# Patient Record
Sex: Male | Born: 1980 | Race: White | Hispanic: Yes | Marital: Single | Smoking: Never smoker
Health system: Southern US, Community
[De-identification: ages and names within clinical notes are randomized; demographics above are authoritative.]

## PROBLEM LIST (undated history)

## (undated) DIAGNOSIS — N390 Urinary tract infection, site not specified: Secondary | ICD-10-CM

## (undated) DIAGNOSIS — A09 Infectious gastroenteritis and colitis, unspecified: Secondary | ICD-10-CM

## (undated) DIAGNOSIS — I10 Essential (primary) hypertension: Secondary | ICD-10-CM

## (undated) DIAGNOSIS — K602 Anal fissure, unspecified: Secondary | ICD-10-CM

## (undated) DIAGNOSIS — E7800 Pure hypercholesterolemia, unspecified: Secondary | ICD-10-CM

## (undated) DIAGNOSIS — I4891 Unspecified atrial fibrillation: Secondary | ICD-10-CM

## (undated) HISTORY — DX: Urinary tract infection, site not specified: N39.0

## (undated) HISTORY — DX: Anal fissure, unspecified: K60.2

## (undated) HISTORY — DX: Essential (primary) hypertension: I10

## (undated) HISTORY — DX: Infectious gastroenteritis and colitis, unspecified: A09

## (undated) HISTORY — PX: APPENDECTOMY: SHX54

## (undated) HISTORY — DX: Pure hypercholesterolemia, unspecified: E78.00

## (undated) HISTORY — DX: Unspecified atrial fibrillation: I48.91

---

## 2010-07-21 ENCOUNTER — Inpatient Hospital Stay (HOSPITAL_COMMUNITY): Admission: EM | Admit: 2010-07-21 | Discharge: 2010-07-21 | Payer: Self-pay | Admitting: Emergency Medicine

## 2010-07-21 ENCOUNTER — Encounter (INDEPENDENT_AMBULATORY_CARE_PROVIDER_SITE_OTHER): Payer: Self-pay | Admitting: Internal Medicine

## 2011-01-22 LAB — POCT I-STAT, CHEM 8
BUN: 16 mg/dL (ref 6–23)
Calcium, Ion: 1.17 mmol/L (ref 1.12–1.32)
Chloride: 105 meq/L (ref 96–112)
Creatinine, Ser: 1 mg/dL (ref 0.4–1.5)
Glucose, Bld: 108 mg/dL — ABNORMAL HIGH (ref 70–99)
HCT: 52 % (ref 39.0–52.0)
Hemoglobin: 17.7 g/dL — ABNORMAL HIGH (ref 13.0–17.0)
Potassium: 3.6 meq/L (ref 3.5–5.1)
Sodium: 141 meq/L (ref 135–145)
TCO2: 26 mmol/L (ref 0–100)

## 2011-01-22 LAB — POCT CARDIAC MARKERS
CKMB, poc: <1 ng/mL — ABNORMAL LOW (ref 1.0–8.0)
Troponin i, poc: <0.05 ng/mL (ref 0.00–0.09)

## 2011-01-22 LAB — RAPID URINE DRUG SCREEN, HOSP PERFORMED
Amphetamines: NOT DETECTED
Cocaine: NOT DETECTED
Tetrahydrocannabinol: NOT DETECTED

## 2011-01-22 LAB — CARDIAC PANEL(CRET KIN+CKTOT+MB+TROPI)
CK, MB: 1.3 ng/mL (ref 0.3–4.0)
Relative Index: 1.3 (ref 0.0–2.5)
Total CK: 102 U/L (ref 7–232)

## 2011-01-22 LAB — CK TOTAL AND CKMB (NOT AT ARMC): Relative Index: 1.3 (ref 0.0–2.5)

## 2011-01-22 LAB — TSH: TSH: 1.565 u[IU]/mL (ref 0.350–4.500)

## 2011-01-22 LAB — TROPONIN I: Troponin I: 0.01 ng/mL (ref 0.00–0.06)

## 2012-04-02 IMAGING — CR DG CHEST 2V
2 series · 2 of 2 positions shown · non-contrast
Comparison: None.

CLINICAL DATA: Shortness of breath

CHEST - 2 VIEW

[w chest pa]
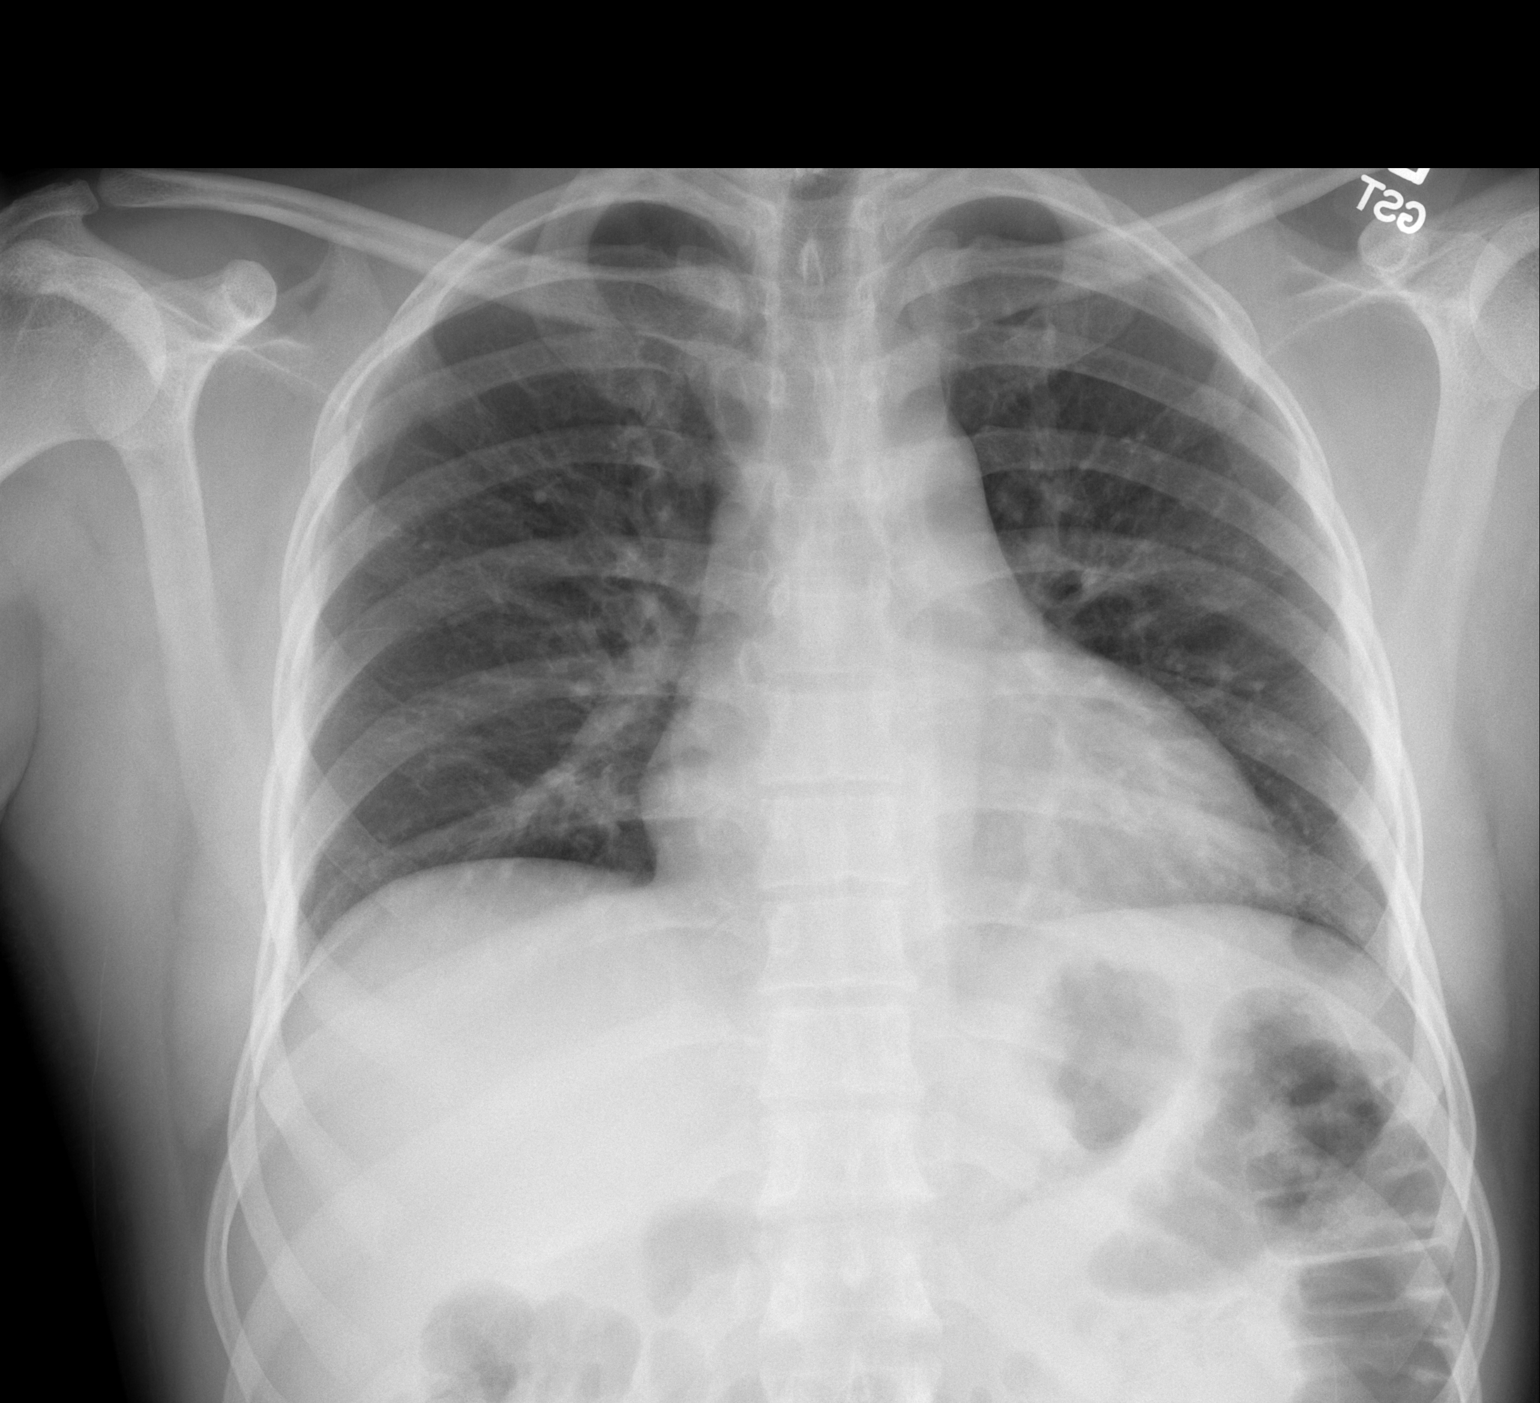

[w chest lat]
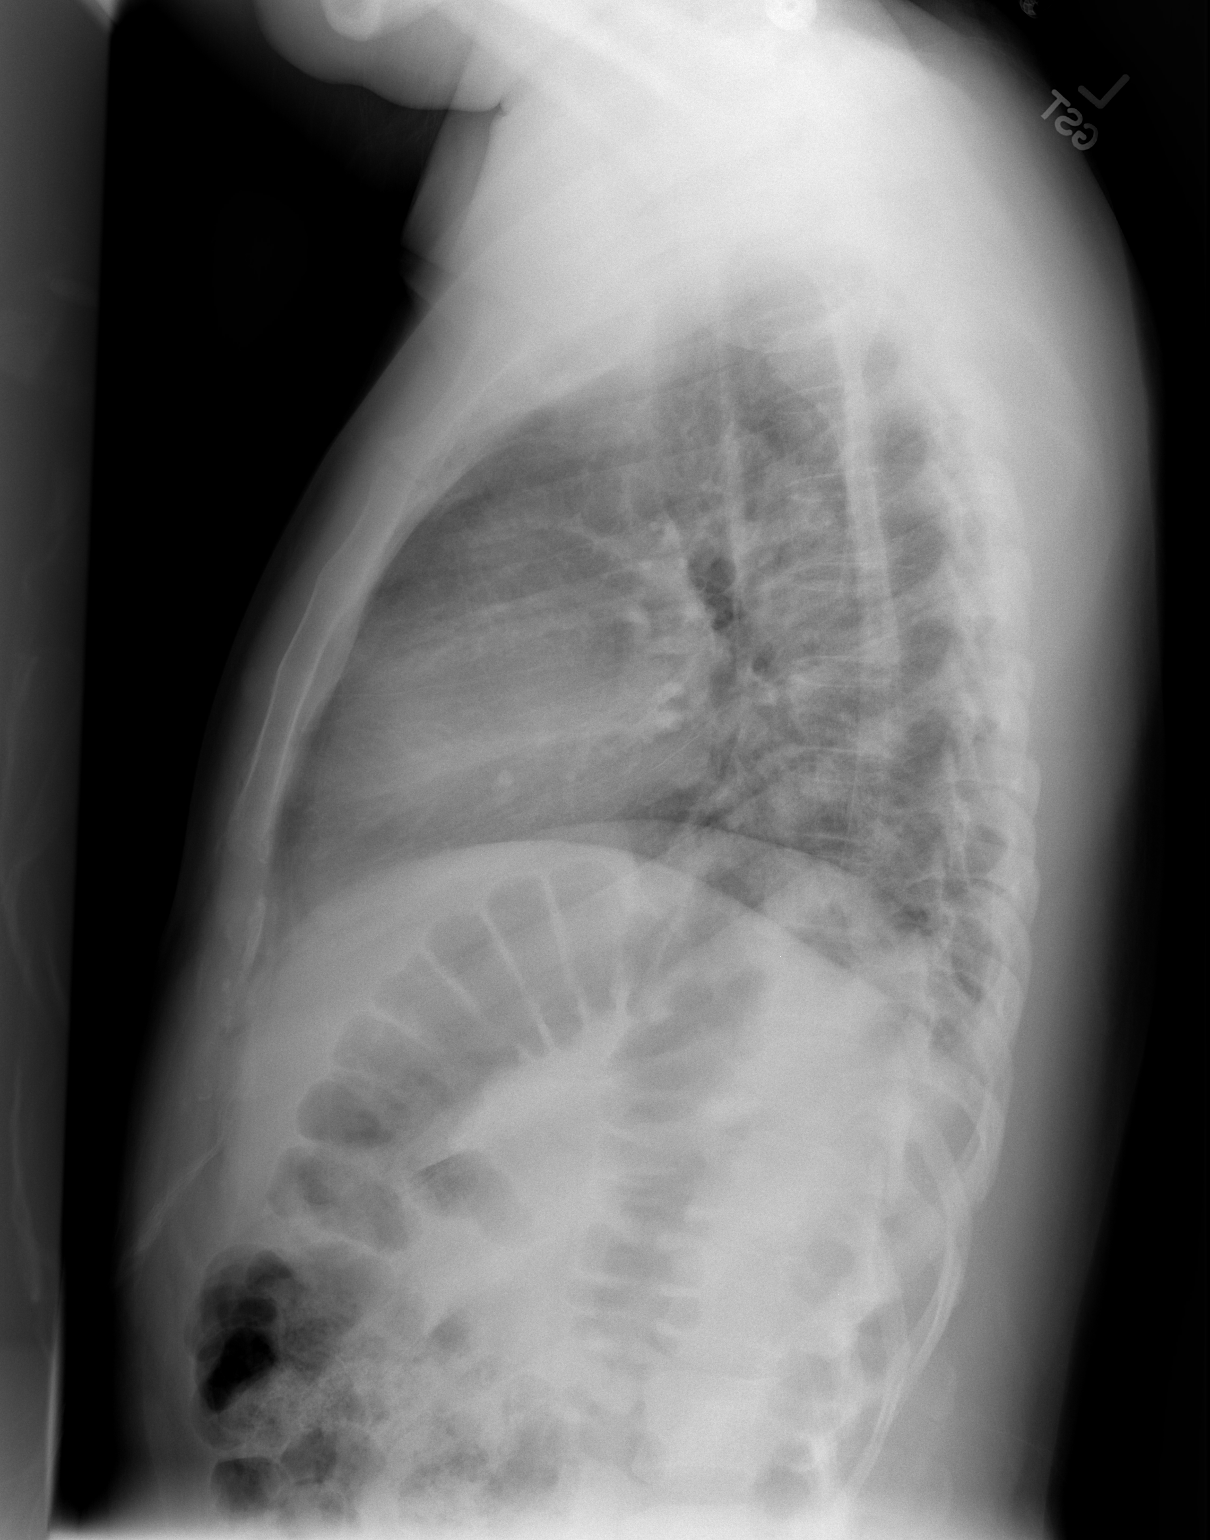

[2 of 2 positions shown; findings below may reference images not displayed]

FINDINGS: The lungs are clear.  Mediastinal contours are normal.
Heart is within upper limits of normal.  No bony abnormality is
seen.
IMPRESSION: No active lung disease.

## 2023-04-01 ENCOUNTER — Emergency Department (HOSPITAL_COMMUNITY)
Admission: EM | Admit: 2023-04-01 | Discharge: 2023-04-01 | Disposition: A | Discharge status: Home / Self Care | Payer: Self-pay | Attending: Emergency Medicine | Admitting: Emergency Medicine

## 2023-04-01 ENCOUNTER — Other Ambulatory Visit: Payer: Self-pay

## 2023-04-01 ENCOUNTER — Emergency Department (HOSPITAL_COMMUNITY): Payer: Self-pay

## 2023-04-01 ENCOUNTER — Encounter (HOSPITAL_COMMUNITY): Payer: Self-pay | Admitting: Emergency Medicine

## 2023-04-01 DIAGNOSIS — L03317 Cellulitis of buttock: Secondary | ICD-10-CM | POA: Insufficient documentation

## 2023-04-01 DIAGNOSIS — K641 Second degree hemorrhoids: Secondary | ICD-10-CM | POA: Insufficient documentation

## 2023-04-01 LAB — CBC WITH DIFFERENTIAL/PLATELET
Abs Immature Granulocytes: 0.01 10*3/uL (ref 0.00–0.07)
Basophils Absolute: 0.1 10*3/uL (ref 0.0–0.1)
Basophils Relative: 1 %
Eosinophils Absolute: 0.2 10*3/uL (ref 0.0–0.5)
Eosinophils Relative: 3 %
HCT: 47.2 % (ref 39.0–52.0)
Hemoglobin: 16.2 g/dL (ref 13.0–17.0)
Immature Granulocytes: 0 %
Lymphocytes Relative: 31 %
Lymphs Abs: 2 10*3/uL (ref 0.7–4.0)
MCH: 30.3 pg (ref 26.0–34.0)
MCHC: 34.3 g/dL (ref 30.0–36.0)
MCV: 88.2 fL (ref 80.0–100.0)
Monocytes Absolute: 0.4 10*3/uL (ref 0.1–1.0)
Monocytes Relative: 6 %
Neutro Abs: 3.8 10*3/uL (ref 1.7–7.7)
Neutrophils Relative %: 59 %
Platelets: 266 10*3/uL (ref 150–400)
RBC: 5.35 MIL/uL (ref 4.22–5.81)
RDW: 13.2 % (ref 11.5–15.5)
WBC: 6.4 10*3/uL (ref 4.0–10.5)
nRBC: 0 % (ref 0.0–0.2)

## 2023-04-01 LAB — URINALYSIS, ROUTINE W REFLEX MICROSCOPIC
Bilirubin Urine: NEGATIVE
Glucose, UA: NEGATIVE mg/dL
Hgb urine dipstick: NEGATIVE
Ketones, ur: NEGATIVE mg/dL
Leukocytes,Ua: NEGATIVE
Nitrite: NEGATIVE
Protein, ur: NEGATIVE mg/dL
Specific Gravity, Urine: 1.017 (ref 1.005–1.030)
pH: 5 (ref 5.0–8.0)

## 2023-04-01 LAB — COMPREHENSIVE METABOLIC PANEL
ALT: 28 U/L (ref 0–44)
AST: 24 U/L (ref 15–41)
Albumin: 4.1 g/dL (ref 3.5–5.0)
Alkaline Phosphatase: 48 U/L (ref 38–126)
Anion gap: 10 (ref 5–15)
BUN: 17 mg/dL (ref 6–20)
CO2: 23 mmol/L (ref 22–32)
Calcium: 9.5 mg/dL (ref 8.9–10.3)
Chloride: 101 mmol/L (ref 98–111)
Creatinine, Ser: 1.02 mg/dL (ref 0.61–1.24)
GFR, Estimated: >60 mL/min (ref >60)
Glucose, Bld: 122 mg/dL — ABNORMAL HIGH (ref 70–99)
Potassium: 4 mmol/L (ref 3.5–5.1)
Sodium: 134 mmol/L — ABNORMAL LOW (ref 135–145)
Total Bilirubin: 0.4 mg/dL (ref 0.3–1.2)
Total Protein: 7.4 g/dL (ref 6.5–8.1)

## 2023-04-01 LAB — LIPASE, BLOOD: Lipase: 34 U/L (ref 11–51)

## 2023-04-01 MED ORDER — SODIUM CHLORIDE 0.9 % IV SOLN
1.0000 g | Freq: Once | INTRAVENOUS | Status: AC
  Administered 2023-04-01: 1 g via INTRAVENOUS
  Filled 2023-04-01: qty 10

## 2023-04-01 MED ORDER — CEPHALEXIN 500 MG PO CAPS: 500.0000 mg | ORAL_CAPSULE | Freq: Two times a day (BID) | ORAL | 0 refills | Status: AC

## 2023-04-01 MED ORDER — IOHEXOL 350 MG/ML SOLN
75.0000 mL | Freq: Once | INTRAVENOUS | Status: AC | PRN
  Administered 2023-04-01: 75 mL via INTRAVENOUS

## 2023-04-01 NOTE — Discharge Instructions (Addendum)
Please take all antibiotics as prescribed and continue using your hemorrhoid medication that was previously provided.  Please follow-up with your primary care physician and with our surgery specialists if your hemorrhoids continue to cause pain or discomfort.  Tome todos los antibiticos segn lo recetado y contine usando el medicamento para hemorroides que le proporcionaron anteriormente.  Haga un seguimiento con su mdico de atencin primaria y con nuestros especialistas en cir[place] si sus hemorroides continan causando dolor o Dentist.

## 2023-04-01 NOTE — ED Provider Notes (Signed)
[place] EMERGENCY DEPARTMENT AT [facility] Provider Note   CSN: [id number] Arrival date & time: 04/01/23  4098     History  Chief Complaint  Patient presents with   Flank Pain   Dysuria    Aaron Gonzales is a 42 y.o. male.  HPI Patient presents with dysuria, low back pain, and rectal pain.  Patient notes that he has had hemorrhoids previously, and over the past few days has noticed blood in his stool, as well as rectal pain, both daily and with defecation.  No relief in spite of using suppositories. Patient also notes worsening low back pain, as well as new dysuria.  No fever, no vomiting, no anterior abdominal pain.  Patient was seen, evaluated at another facility few weeks ago, states that he had blood tests that were normal.    Home Medications Prior to Admission medications   Medication Sig Start Date End Date Taking? Authorizing Provider  cephALEXin (KEFLEX) 500 MG capsule Take 1 capsule (500 mg total) by mouth 2 (two) times daily for 5 days. 04/01/23 04/06/23 Yes Gerhard Munch, MD      Allergies    Patient has no allergy information on record.    Review of Systems   Review of Systems  All other systems reviewed and are negative.   Physical Exam Updated Vital Signs BP (!) 170/98 (BP Location: Right Arm)   Pulse 72   Temp 97.9 F (36.6 C) (Oral)   Resp 16   Ht 5\' 3"  (1.6 m)   Wt 77.1 kg   SpO2 98%   BMI 30.11 kg/m  Physical Exam Vitals and nursing note reviewed. Exam conducted with a chaperone present.  Constitutional:      General: He is not in acute distress.    Appearance: He is well-developed.  HENT:     Head: Normocephalic and atraumatic.  Eyes:     Conjunctiva/sclera: Conjunctivae normal.  Cardiovascular:     Rate and Rhythm: Normal rate and regular rhythm.  Pulmonary:     Effort: Pulmonary effort is normal. No respiratory distress.     Breath sounds: No stridor.  Abdominal:     General: There is no distension.   Genitourinary:    Penis: Uncircumcised.       Skin:    General: Skin is warm and dry.  Neurological:     Mental Status: He is alert and oriented to person, place, and time.     ED Results / Procedures / Treatments   Labs (all labs ordered are listed, but only abnormal results are displayed) Labs Reviewed  COMPREHENSIVE METABOLIC PANEL - Abnormal; Notable for the following components:      Result Value   Sodium 134 (*)    Glucose, Bld 122 (*)    All other components within normal limits  URINALYSIS, ROUTINE W REFLEX MICROSCOPIC  CBC WITH DIFFERENTIAL/PLATELET  LIPASE, BLOOD    EKG None  Radiology CT ABDOMEN PELVIS W CONTRAST  Result Date: 04/01/2023 CLINICAL DATA:  Left flank pain associated with dysuria and bleeding hemorrhoids for evaluation of perirectal abscess EXAM: CT ABDOMEN AND PELVIS WITH CONTRAST TECHNIQUE: Multidetector CT imaging of the abdomen and pelvis was performed using the standard protocol following bolus administration of intravenous contrast. RADIATION DOSE REDUCTION: This exam was performed according to the departmental dose-optimization program which includes automated exposure control, adjustment of the mA and/or kV according to patient size and/or use of iterative reconstruction technique. CONTRAST:  75mL OMNIPAQUE IOHEXOL 350 MG/ML SOLN COMPARISON:  None Available. FINDINGS: Lower chest: No focal consolidation or pulmonary nodule in the lung bases. No pleural effusion or pneumothorax demonstrated. Partially imaged heart size is normal. Hepatobiliary: No focal hepatic lesions. No intra or extrahepatic biliary ductal dilation. Normal gallbladder. Pancreas: No focal lesions or main ductal dilation. Spleen: Normal in size without focal abnormality. Adrenals/Urinary Tract: No adrenal nodules. No suspicious renal mass, calculi or hydronephrosis. No focal bladder wall thickening. Stomach/Bowel: Normal appearance of the stomach. No evidence of bowel wall  thickening, distention, or inflammatory changes. Normal appendix. Vascular/Lymphatic: No significant vascular findings are present. No enlarged abdominal or pelvic lymph nodes. Reproductive: Prostate is unremarkable. Other: No free fluid, fluid collection, or free air. Musculoskeletal: No acute or abnormal lytic or blastic osseous lesions. Cutaneous soft tissue thickening of the right medial gluteal cleft extending to the rectum. No fluid collection or definite fluid-filled tract. IMPRESSION: 1. Cutaneous soft tissue thickening of the right medial gluteal cleft extending to the rectum, which may represent cellulitis. No fluid collection or definite fluid-filled tract. 2. No acute intra-abdominal or pelvic process. Electronically Signed   By: Agustin Cree M.D.   On: 04/01/2023 09:06    Procedures Procedures    Medications Ordered in ED Medications  cefTRIAXone (ROCEPHIN) 1 g in sodium chloride 0.9 % 100 mL IVPB (has no administration in time range)  iohexol (OMNIPAQUE) 350 MG/ML injection 75 mL (75 mLs Intravenous Contrast Given 04/01/23 0853)    ED Course/ Medical Decision Making/ A&P                             Medical Decision Making Generally well-appearing adult male presents with rectal pain, bleeding, and back pain.  Hemorrhoids, renal stone, both likely considerations, but given the worsening symptoms in spite of using appropriate medications, other considerations include pararectal abscess, or other intra-abdominal/intraperitoneal processes. Patient had labs, urine, CT ordered.  Amount and/or Complexity of Data Reviewed External Data Reviewed: notes.    Details: No hospital notes for the past 10 years Labs: ordered. Decision-making details documented in ED Course. Radiology: ordered and independent interpretation performed. Decision-making details documented in ED Course.  Risk Prescription drug management.   9:45 AM Patient awake, alert, hemodynamically unremarkable.  I reviewed  his labs which are unremarkable, no leukocytosis, there is little evidence for bacteremia or sepsis or fasciitis. However, patient CT scan does show cellulitis in the area identified on physical exam, and the patient will start antibiotics.  We discussed the importance of antibiotics on discharge after IV antibiotics were complete, following up with her surgical colleagues for consideration of his hemorrhoids that are recurrent over the past 2 years.  Absent evidence for bacteremia, sepsis, patient discharged in stable condition.        Final Clinical Impression(s) / ED Diagnoses Final diagnoses:  Cellulitis of buttock  Grade II hemorrhoids    Rx / DC Orders ED Discharge Orders          Ordered    cephALEXin (KEFLEX) 500 MG capsule  2 times daily        04/01/23 0945              Gerhard Munch, MD 04/01/23 0945

## 2023-04-01 NOTE — ED Triage Notes (Signed)
Spanish interpreter used during triage. Pt states L flank pain began yesterday, and he is also having dysuria and hemorrhoids with bleeding that started today. Denies any hematuria

## 2023-04-16 ENCOUNTER — Ambulatory Visit (INDEPENDENT_AMBULATORY_CARE_PROVIDER_SITE_OTHER): Payer: Self-pay | Admitting: Internal Medicine

## 2023-04-16 ENCOUNTER — Encounter: Payer: Self-pay | Admitting: Internal Medicine

## 2023-04-16 VITALS — BP 140/80 | HR 78 | Ht 59.0 in | Wt 154.0 lb

## 2023-04-16 DIAGNOSIS — K649 Unspecified hemorrhoids: Secondary | ICD-10-CM

## 2023-04-16 DIAGNOSIS — K6289 Other specified diseases of anus and rectum: Secondary | ICD-10-CM

## 2023-04-16 DIAGNOSIS — K5900 Constipation, unspecified: Secondary | ICD-10-CM

## 2023-04-16 DIAGNOSIS — K625 Hemorrhage of anus and rectum: Secondary | ICD-10-CM

## 2023-04-16 MED ORDER — HYDROCORTISONE (PERIANAL) 2.5 % EX CREA: 1.0000 | TOPICAL_CREAM | Freq: Two times a day (BID) | CUTANEOUS | 0 refills | Status: AC

## 2023-04-16 MED ORDER — HYDROCORTISONE (PERIANAL) 2.5 % EX CREA: 1.0000 | TOPICAL_CREAM | Freq: Two times a day (BID) | CUTANEOUS | 0 refills | Status: DC

## 2023-04-16 NOTE — Patient Instructions (Addendum)
We have sent the following medications to your pharmacy for you to pick up at your convenience: Anusol  A referral was place for dermatology they will contact you to schedule an appointment   Si su presin arterial en su visita era 140/90 o ms, comunquese con su mdico de atencin primaria para Presenter, broadcasting.  Si tiene 65 aos o ms, su ndice de [employer] corporal debe estar entre 23 y 30. Su ndice de masa corporal es de 31,1 kg/m. Si esto est fuera del rango mencionado anteriormente, considere realizar un seguimiento con su proveedor de Marine scientist.  Si tiene 64 aos o menos, su ndice de [employer] corporal debe estar entre 19 y 55. Su ndice de masa corporal es de 31,1 kg/m. Si esto est fuera del rango mencionado anteriormente, considere realizar un seguimiento con su proveedor de Marine scientist.   Los proveedores de Adult nurse GI desean alentarlo a Chemical engineer MYCHART para comunicarse con los proveedores para solicitudes o preguntas que no sean urgentes.  Debido a los Retail buyer de espera en el telfono, enviar un mensaje a su proveedor a travs de [employer] puede ser una forma ms rpida y eficiente de obtener una respuesta.  Espere 48 horas hbiles para recibir [employer].  Recuerde que esto es para solicitudes no urgentes.     Gracias por confiarme su atencin y por elegir [employer]. Dra. Eulah Pont

## 2023-04-16 NOTE — Progress Notes (Signed)
Chief Complaint: Rectal bleeding  HPI : 41 year old male with history of A-fib, hemorrhoids, and HTN presents with rectal bleeding  He has been feeling his hemorrhoids for 14 years and he was treated in [place] for this in the past. Now he would like to get a procedure to treat his hemorrhoids if possible. He has taken different medications and creams that have not been effective in the past. He thinks that his hemorrhoids are currently inflamed with rectal pain/burning. Endorses occasional rectal bleeding in the past. Denies prior banding. Denies prior colonoscopy. Endorses straining when passing BMs. Endorses constipation. He has two BMs per day on average with a small amount of stool passed each time. Denies abdominal pain or weight loss. It sometimes burns when he urinates. Endorses regurgitation that he it hinks is acid reflux. Endorses nausea. Denies vomiting. Denies dysphagia. Denies family history of GI issues. He was recently seen in the ED on 04/01/23 when he was found to have cellulitis for which he was treated with 5 days of antibiotic therapy. He has also noted a rash and lesions on the foreskin of his penis.  Wt Readings from Last 3 Encounters:  04/16/23 154 lb (69.9 kg)  04/01/23 169 lb 15.6 oz (77.1 kg)   Past Medical History:  Diagnosis Date   Anal fissure    Atrial fibrillation (HCC)    Elevated cholesterol    High blood pressure    Infectious colitis    UTI (urinary tract infection)    Past Surgical History:  Procedure Laterality Date   APPENDECTOMY     Family History  Problem Relation Age of Onset   Liver disease Neg Hx    Colon cancer Neg Hx    Esophageal cancer Neg Hx    Social History   Tobacco Use   Smoking status: Never   Smokeless tobacco: Never  Vaping Use   Vaping Use: Never used  Substance Use Topics   Alcohol use: Not Currently   Drug use: Never   Current Outpatient Medications  Medication Sig Dispense Refill   lisinopril (ZESTRIL) 20 MG  tablet Take 20 mg by mouth at bedtime.     No current facility-administered medications for this visit.   Not on File   Review of Systems: All systems reviewed and negative except where noted in HPI.   Physical Exam: BP (!) 140/80   Pulse 78   Ht 4\' 11"  (1.499 m)   Wt 154 lb (69.9 kg)   BMI 31.10 kg/m  Constitutional: Pleasant,well-developed, male in no acute distress. HEENT: Normocephalic and atraumatic. Conjunctivae are normal. No scleral icterus. Cardiovascular: Normal rate, regular rhythm.  Pulmonary/chest: Effort normal and breath sounds normal. No wheezing, rales or rhonchi. Abdominal: Soft, nondistended, nontender. Bowel sounds active throughout. There are no masses palpable. No hepatomegaly. Rectal: Grade 1 internal hemorrhoids. Sores in between his penis and anus that look like venous blebs Extremities: No edema Neurological: Alert and oriented to person place and time. Skin: Skin is warm and dry. No rashes noted. Psychiatric: Normal mood and affect. Behavior is normal.  Labs 03/2023: CBC nml. CMP with mildly low Na of 134. Lipase nml. U/A negative.  CT A/P w/contrast 04/01/23: IMPRESSION: 1. Cutaneous soft tissue thickening of the right medial gluteal cleft extending to the rectum, which may represent cellulitis. No fluid collection or definite fluid-filled tract. 2. No acute intra-abdominal or pelvic process.  ASSESSMENT AND PLAN: Hemorrhoids Rectal bleeding Rectal pain Constipation Patient presents for rectal pain and rectal  bleeding due to hemorrhoids. He was recently diagnosed with cellulitis after an ED visit two weeks ago and was treated with 5 days of cephalexin. On rectal exam it appears that he has two lesions in his perineal area in between his anus and penis that looks to me like possible hidradenitis suppurativa or alternatively another dermatologic issue. Thus will refer to dermatology for further evaluation. He does have internal grade 1 hemorrhoids on  rectal exam as well, but he seems to describe the lesions in his perineal area between the penis and anus as "hemorrhoids." I tried to clarify with the help of the interpreter that the lesions are not hemorrhoids. For his internal hemorrhoids, will give him Anusol HC cream BID for 7 days. If this is not effective, then can consider hemorrhoidal banding in the future. I did recommend a colonoscopy for rectal bleeding and rectal pain evaluation, but he declined to schedule at this time and would like to see how much the colonoscopy would cost out-of-pocket first. - Anusol HC cream BID for 7 days - Will get him estimates for hemorrhoidal banding and colonoscopy. Patient will consider these procedures and get back to [place] if he is interested. - Referral to dermatology due to perineal lesions - Consider constipation therapies in the future  Eulah Pont, MD  I spent 61 minutes of time, including in depth chart review, independent review of results as outlined above, communicating results with the patient directly, face-to-face time with the patient, coordinating care, ordering studies and medications as appropriate, and documentation.

## 2023-04-23 ENCOUNTER — Other Ambulatory Visit: Payer: Self-pay

## 2023-04-23 ENCOUNTER — Telehealth: Payer: Self-pay | Admitting: Internal Medicine

## 2023-04-23 DIAGNOSIS — K625 Hemorrhage of anus and rectum: Secondary | ICD-10-CM

## 2023-04-23 DIAGNOSIS — K6289 Other specified diseases of anus and rectum: Secondary | ICD-10-CM

## 2023-04-23 NOTE — Telephone Encounter (Signed)
Patient came into office to schedule his colonoscopy. It is scheduled for 7/2 at 8:00 am with Dr. Leonides Schanz. Patient is requesting prep-instructions in spanish and he will come by office to pick up. Patient is also self-pay, so he would like a cheap prep- rx. Please advise.

## 2023-04-23 NOTE — Telephone Encounter (Signed)
Called both numbers on patients chart and his emergency contact to let him know his instructions and prep were ready for him to pick up. None of the numbers I called had a VM set up. Will continue to call patient until he is made aware.

## 2023-05-10 ENCOUNTER — Telehealth: Payer: Self-pay | Admitting: Internal Medicine

## 2023-05-10 NOTE — Telephone Encounter (Signed)
I have tried a few times this morning and have not been able to reach the patient. Both numbers on file just ring then go to a busy signal.

## 2023-05-10 NOTE — Telephone Encounter (Signed)
Inbound call from patient's care partner stating she will be in today to pick up patient's prep medication. Please advise, thank you

## 2023-05-10 NOTE — Telephone Encounter (Signed)
Patient needs prep medication for procedure he has tomorrow. Please advise.  [facility] on [address]   Thank you

## 2023-05-11 ENCOUNTER — Ambulatory Visit (AMBULATORY_SURGERY_CENTER): Payer: Self-pay | Admitting: Internal Medicine

## 2023-05-11 ENCOUNTER — Encounter: Payer: Self-pay | Admitting: Internal Medicine

## 2023-05-11 VITALS — BP 158/99 | HR 59 | Temp 98.9°F | Resp 12 | Ht 59.0 in | Wt 154.0 lb

## 2023-05-11 DIAGNOSIS — K6289 Other specified diseases of anus and rectum: Secondary | ICD-10-CM

## 2023-05-11 DIAGNOSIS — K625 Hemorrhage of anus and rectum: Secondary | ICD-10-CM

## 2023-05-11 MED ORDER — SODIUM CHLORIDE 0.9 % IV SOLN: 500.0000 mL | Freq: Once | INTRAVENOUS | Status: DC

## 2023-05-11 MED ORDER — HYDROCORTISONE (PERIANAL) 2.5 % EX CREA: 1.0000 | TOPICAL_CREAM | Freq: Two times a day (BID) | CUTANEOUS | 1 refills | Status: AC

## 2023-05-11 NOTE — Patient Instructions (Signed)
Please read handouts provided. Anusol HC cream for hemorrhoids. Repeat colonoscopy in 10 years for screening. For issues with acid reflux, okay to use Prilosec 20 mg daily OTC. Appointment for hemorrhoid banding July 01, 2023.  USTED TUVO UN PROCEDIMIENTO ENDOSCPICO HOY EN EL [facility]:   Lea el informe del procedimiento que se le entreg para cualquier pregunta especfica sobre lo que se Dentist.  Si el informe del examen no responde a sus preguntas, por favor llame a su gastroenterlogo para aclararlo.  Si usted solicit que no se le den [employer] de lo que se Clinical cytogeneticist en su procedimiento al [employer] va a cuidar, entonces el informe del procedimiento se ha incluido en un sobre sellado para que usted lo revise despus cuando le sea ms conveniente.   LO QUE PUEDE ESPERAR: Algunas sensaciones de hinchazn en el abdomen.  Puede tener ms gases de lo normal.  El caminar puede ayudarle a eliminar el aire que se le puso en el tracto gastrointestinal durante el procedimiento y reducir la hinchazn.  Si le hicieron una endoscopia inferior (como una colonoscopia o una sigmoidoscopia flexible), podra notar manchas de sangre en las heces fecales o en el papel higinico.  Si se someti a una preparacin intestinal para su procedimiento, es posible que no tenga una evacuacin intestinal normal durante [employer].   Tenga en cuenta:  Es posible que note un poco de irritacin y congestin en la nariz o algn drenaje.  Esto es debido al oxgeno [employer] durante su procedimiento.  No hay que preocuparse y esto debe desaparecer ms o Regulatory affairs officer.   SNTOMAS PARA REPORTAR INMEDIATAMENTE:  Despus de una endoscopia inferior (colonoscopia o sigmoidoscopia flexible):  Cantidades excesivas de sangre en las heces fecales  Sensibilidad significativa o empeoramiento de los dolores abdominales   Hinchazn aguda del abdomen que antes no tena   Fiebre de 100F o ms      Para asuntos urgentes o de Associate Professor, puede comunicarse con un gastroenterlogo a cualquier hora llamando al [phone].  DIETA:  Recomendamos una comida pequea al principio, pero luego puede continuar con su dieta normal.  Tome muchos lquidos, Tax adviser las bebidas alcohlicas durante 24 horas.    ACTIVIDAD:  Debe planear tomarse las cosas con calma por el resto del da y no debe CONDUCIR ni usar maquinaria pesada Patent examiner (debido a los medicamentos de sedacin utilizados durante el examen).     SEGUIMIENTO: Nuestro personal llamar al nmero que aparece en su historial al siguiente da hbil de su procedimiento para ver cmo se siente y para responder cualquier pregunta o inquietud que pueda tener con respecto a la informacin que se le dio despus del procedimiento. Si no podemos contactarle, le dejaremos un mensaje.  Sin embargo, si se siente bien y no tiene English as a second language teacher, no es necesario que nos devuelva la llamada.  Asumiremos que ha regresado a sus actividades diarias normales sin incidentes. Si se le tomaron algunas biopsias, le contactaremos por telfono o por carta en las prximas 3 semanas.  Si no ha sabido [facility] biopsias en el transcurso de 3 semanas, por favor llmenos al [phone].   FIRMAS/CONFIDENCIALIDAD: Usted y/o el acompaante que le cuide han firmado documentos que se ingresarn en su historial mdico electrnico.  Estas firmas atestiguan el hecho de que la informacin anterior

## 2023-05-11 NOTE — Op Note (Signed)
[facility] Patient Name: Aaron Gonzales Procedure Date: 05/11/2023 7:01 AM MRN: [id number] Endoscopist: Particia Lather , , [id number] Age: 42 Referring MD:  Date of Birth: 1981/08/22 Gender: Male Account #: [id number] Procedure:                Colonoscopy Indications:              Rectal bleeding, Rectal pain Medicines:                Monitored Anesthesia Care Procedure:                Pre-Anesthesia Assessment:                           - Prior to the procedure, a History and Physical                            was performed, and patient medications and                            allergies were reviewed. The patient's tolerance of                            previous anesthesia was also reviewed. The risks                            and benefits of the procedure and the sedation                            options and risks were discussed with the patient.                            All questions were answered, and informed consent                            was obtained. Prior Anticoagulants: The patient has                            taken no anticoagulant or antiplatelet agents. ASA                            Grade Assessment: II - A patient with mild systemic                            disease. After reviewing the risks and benefits,                            the patient was deemed in satisfactory condition to                            undergo the procedure.                           After obtaining informed consent, the colonoscope  was passed under direct vision. Throughout the                            procedure, the patient's blood pressure, pulse, and                            oxygen saturations were monitored continuously. The                            CF [id number] #[id number] was introduced through the anus                            and advanced to the the terminal ileum. The                            colonoscopy was performed  without difficulty. The                            patient tolerated the procedure well. The quality                            of the bowel preparation was good. The terminal                            ileum, ileocecal valve, appendiceal orifice, and                            rectum were photographed. Scope In: 8:17:29 AM Scope Out: 8:28:53 AM Scope Withdrawal Time: 0 hours 8 minutes 1 second  Total Procedure Duration: 0 hours 11 minutes 24 seconds  Findings:                 The terminal ileum appeared normal.                           Non-bleeding internal hemorrhoids were found during                            retroflexion. Complications:            No immediate complications. Estimated Blood Loss:     Estimated blood loss was minimal. Impression:               - The examined portion of the ileum was normal.                           - Non-bleeding internal hemorrhoids.                           - No specimens collected. Recommendation:           - Discharge patient to home (with escort).                           - Recommend use of your Anusol HC cream for  hemorrhoids.                           - If cream is not effective, then can schedule for                            hemorrhoid banding.                           - Repeat colonoscopy in 10 years for screening                            purposes.                           - For issues with acid reflux, okay to use Prilosec                            20 mg daily OTC.                           - The findings and recommendations were discussed                            with the patient. Dr Particia Lather "Alan Ripper" Leonides Schanz,  05/11/2023 8:41:17 AM

## 2023-05-11 NOTE — Progress Notes (Signed)
GASTROENTEROLOGY PROCEDURE H&P NOTE   Primary Care Physician: Patient, No Pcp Per    Reason for Procedure:   Rectal bleeding, rectal pain  Plan:    Colonoscopy  Patient is appropriate for endoscopic procedure(s) in the ambulatory ([facility]) setting.  The nature of the procedure, as well as the risks, benefits, and alternatives were carefully and thoroughly reviewed with the patient. Ample time for discussion and questions allowed. The patient understood, was satisfied, and agreed to proceed.     HPI: Aaron Gonzales is a 42 y.o. male who presents for colonoscopy for evaluation of rectal bleeding and rectal pain .  Patient was most recently seen in the Gastroenterology Clinic on 04/16/23.  No interval change in medical history since that appointment. Please refer to that note for full details regarding GI history and clinical presentation.   Past Medical History:  Diagnosis Date   Anal fissure    Atrial fibrillation (HCC)    Elevated cholesterol    High blood pressure    Infectious colitis    UTI (urinary tract infection)     Past Surgical History:  Procedure Laterality Date   APPENDECTOMY      Prior to Admission medications   Medication Sig Start Date End Date Taking? Authorizing Provider  lisinopril (ZESTRIL) 20 MG tablet Take 20 mg by mouth at bedtime. 04/03/23  Yes [provider]    Current Outpatient Medications  Medication Sig Dispense Refill   lisinopril (ZESTRIL) 20 MG tablet Take 20 mg by mouth at bedtime.     Current Facility-Administered Medications  Medication Dose Route Frequency Provider Last Rate Last Admin   0.9 %  sodium chloride infusion  500 mL Intravenous Once Imogene Burn, MD        Allergies as of 05/11/2023   (Not on File)    Family History  Problem Relation Age of Onset   Liver disease Neg Hx    Colon cancer Neg Hx    Esophageal cancer Neg Hx    Stomach cancer Neg Hx    Rectal cancer Neg Hx     Social History    Socioeconomic History   Marital status: Single    Spouse name: Not on file   Number of children: 1   Years of education: Not on file   Highest education level: Not on file  Occupational History   Occupation: painter  Tobacco Use   Smoking status: Never   Smokeless tobacco: Never  Vaping Use   Vaping Use: Never used  Substance and Sexual Activity   Alcohol use: Not Currently   Drug use: Never   Sexual activity: Not on file  Other Topics Concern   Not on file  Social History Narrative   Not on file   Social Determinants of Health   Financial Resource Strain: Not on file  Food Insecurity: Not on file  Transportation Needs: Not on file  Physical Activity: Not on file  Stress: Not on file  Social Connections: Not on file  Intimate Partner Violence: Not on file    Physical Exam: Vital signs in last 24 hours: BP (!) 139/100   Pulse 78   Temp 98.9 F (37.2 C)   Ht 4\' 11"  (1.499 m)   Wt 154 lb (69.9 kg)   SpO2 99%   BMI 31.10 kg/m  GEN: NAD EYE: Sclerae anicteric ENT: MMM CV: Non-tachycardic Pulm: No increased WOB GI: Soft NEURO:  Alert & Oriented   Eulah Pont, MD [facility]  05/11/2023 8:13 AM

## 2023-05-11 NOTE — Progress Notes (Signed)
To pacu, VSS. Report to Rn.tb 

## 2023-05-11 NOTE — Progress Notes (Signed)
VS by CW  Pt's states no medical or surgical changes since previsit or office visit.   Interpreter used today at the [facility] for this pt.  Interpreter's name is- Jorja Loa

## 2023-05-12 ENCOUNTER — Telehealth: Payer: Self-pay

## 2023-05-12 NOTE — Telephone Encounter (Signed)
Post procedure follow up call, no answer 

## 2023-05-18 ENCOUNTER — Encounter: Payer: Self-pay | Admitting: Internal Medicine

## 2023-05-24 ENCOUNTER — Encounter: Payer: Self-pay | Admitting: Internal Medicine

## 2023-07-01 ENCOUNTER — Encounter: Payer: Self-pay | Admitting: Internal Medicine

## 2023-07-01 ENCOUNTER — Ambulatory Visit: Payer: Self-pay | Admitting: Internal Medicine

## 2023-07-01 VITALS — BP 138/80 | HR 99 | Ht 59.0 in | Wt 156.0 lb

## 2023-07-01 DIAGNOSIS — K649 Unspecified hemorrhoids: Secondary | ICD-10-CM

## 2023-07-01 NOTE — Patient Instructions (Addendum)
You have been scheduled for an appointment with Dr. Leonides Schanz on 08/16/23 at 3:40 PM . Please arrive 10 minutes early for your appointment.     HEMORRHOID BANDING PROCEDURE    FOLLOW-UP CARE   The procedure you have had should have been relatively painless since the banding of the area involved does not have nerve endings and there is no pain sensation.  The rubber band cuts off the blood supply to the hemorrhoid and the band may fall off as soon as 48 hours after the banding (the band may occasionally be seen in the toilet bowl following a bowel movement). You may notice a temporary feeling of fullness in the rectum which should respond adequately to plain Tylenol or Motrin.  Following the banding, avoid strenuous exercise that evening and resume full activity the next day.  A sitz bath (soaking in a warm tub) or bidet is soothing, and can be useful for cleansing the area after bowel movements.     To avoid constipation, take two tablespoons of natural wheat bran, natural oat bran, flax, Benefiber or any over the counter fiber supplement and increase your water intake to 7-8 glasses daily.    Unless you have been prescribed anorectal medication, do not put anything inside your rectum for two weeks: No suppositories, enemas, fingers, etc.  Occasionally, you may have more bleeding than usual after the banding procedure.  This is often from the untreated hemorrhoids rather than the treated one.  Don't be concerned if there is a tablespoon or so of blood.  If there is more blood than this, lie flat with your bottom higher than your head and apply an ice pack to the area. If the bleeding does not stop within a half an hour or if you feel faint, call our office at [phone] or go to the emergency room.  Problems are not common; however, if there is a substantial amount of bleeding, severe pain, chills, fever or difficulty passing urine (very rare) or other problems, you should call us at [phone]  [phone] or report to the nearest emergency room.  Do not stay seated continuously for more than 2-3 hours for a day or two after the procedure.  Tighten your buttock muscles 10-15 times every two hours and take 10-15 deep breaths every 1-2 hours.  Do not spend more than a few minutes on the toilet if you cannot empty your bowel; instead re-visit the toilet at a later time.

## 2023-07-01 NOTE — Progress Notes (Signed)
PROCEDURE NOTE: The patient presents with symptomatic grade 1  hemorrhoids, requesting rubber band ligation of his/her hemorrhoidal disease. He is currently having some rectal pain.  All risks, benefits and alternative forms of therapy were described and informed consent was obtained.  The anorectum was pre-medicated with nitroglycerin and Recticare The decision was made to band the posterior internal hemorrhoid, and the [facility] O'Regan System was used to perform band ligation without complication.  Digital anorectal examination was then performed to assure proper positioning of the band, and to adjust the banded tissue as required.  The patient was discharged home without pain or other issues.  Dietary and behavioral recommendations were given and along with follow-up instructions.     The following adjunctive treatments were recommended: Encourage fiber intake Avoid straining when having bowel movements Use a stool softener if patient is having hard stools  The patient will return in 4 weeks for  follow-up and possible additional banding as required. No complications were encountered and the patient tolerated the procedure well.

## 2023-08-16 ENCOUNTER — Encounter: Payer: Self-pay | Admitting: Internal Medicine

## 2023-08-16 ENCOUNTER — Ambulatory Visit: Payer: Self-pay | Admitting: Internal Medicine

## 2023-08-16 VITALS — BP 140/90 | HR 84 | Ht 60.5 in | Wt 154.2 lb

## 2023-08-16 DIAGNOSIS — K649 Unspecified hemorrhoids: Secondary | ICD-10-CM

## 2023-08-16 NOTE — Patient Instructions (Signed)
HEMORRHOID BANDING PROCEDURE    FOLLOW-UP CARE   The procedure you have had should have been relatively painless since the banding of the area involved does not have nerve endings and there is no pain sensation.  The rubber band cuts off the blood supply to the hemorrhoid and the band may fall off as soon as 48 hours after the banding (the band may occasionally be seen in the toilet bowl following a bowel movement). You may notice a temporary feeling of fullness in the rectum which should respond adequately to plain Tylenol or Motrin.  Following the banding, avoid strenuous exercise that evening and resume full activity the next day.  A sitz bath (soaking in a warm tub) or bidet is soothing, and can be useful for cleansing the area after bowel movements.     To avoid constipation, take two tablespoons of natural wheat bran, natural oat bran, flax, Benefiber or any over the counter fiber supplement and increase your water intake to 7-8 glasses daily.    Unless you have been prescribed anorectal medication, do not put anything inside your rectum for two weeks: No suppositories, enemas, fingers, etc.  Occasionally, you may have more bleeding than usual after the banding procedure.  This is often from the untreated hemorrhoids rather than the treated one.  Don't be concerned if there is a tablespoon or so of blood.  If there is more blood than this, lie flat with your bottom higher than your head and apply an ice pack to the area. If the bleeding does not stop within a half an hour or if you feel faint, call our office at [phone] or go to the emergency room.  Problems are not common; however, if there is a substantial amount of bleeding, severe pain, chills, fever or difficulty passing urine (very rare) or other problems, you should call us at [phone] or report to the nearest emergency room.  Do not stay seated continuously for more than 2-3 hours for a day or two after the procedure.   Tighten your buttock muscles 10-15 times every two hours and take 10-15 deep breaths every 1-2 hours.  Do not spend more than a few minutes on the toilet if you cannot empty your bowel; instead re-visit the toilet at a later time.   Thank you for entrusting me with your care and for choosing [facility], Dr. Christia Reading

## 2023-08-16 NOTE — Progress Notes (Signed)
PROCEDURE NOTE: The patient presents with symptomatic grade 1 hemorrhoids, requesting rubber band ligation of his/her hemorrhoidal disease.  All risks, benefits and alternative forms of therapy were described and informed consent was obtained.  In the Left Lateral Decubitus position the anorectum was pre-medicated with Recticare and nitroglycerin The decision was made to band the right anterior internal hemorrhoid, and the [facility] O'Regan System was used to perform band ligation without complication.  Digital anorectal examination was then performed to assure proper positioning of the band, and to adjust the banded tissue as required.  The patient was discharged home without pain or other issues.  Dietary and behavioral recommendations were given and along with follow-up instructions.     The patient will return in 4 weeks for follow-up and possible additional banding as required. No complications were encountered and the patient tolerated the procedure well.

## 2023-10-11 ENCOUNTER — Encounter: Payer: Self-pay | Admitting: Internal Medicine

## 2023-10-11 ENCOUNTER — Ambulatory Visit: Payer: Self-pay | Admitting: Internal Medicine

## 2023-10-11 VITALS — BP 130/82 | HR 76 | Ht 59.0 in | Wt 154.0 lb

## 2023-10-11 DIAGNOSIS — K649 Unspecified hemorrhoids: Secondary | ICD-10-CM

## 2023-10-11 DIAGNOSIS — K602 Anal fissure, unspecified: Secondary | ICD-10-CM

## 2023-10-11 DIAGNOSIS — K640 First degree hemorrhoids: Secondary | ICD-10-CM

## 2023-10-11 MED ORDER — AMBULATORY NON FORMULARY MEDICATION: 0 refills | Status: DC

## 2023-10-11 NOTE — Patient Instructions (Addendum)
Le hemos enviado una receta de gel de Diltiazem al 2% a [employer]. Usando su dedo ndice, debe aplicar una pequea cantidad de medicamento dentro del recto hasta el primer nudillo/articulacin 3 veces al da x 8 semanas.  La informacin de [facility] se encuentra a continuacin: Direccin: [address], [place], [place] [address]  [phone]  Empiece a tomar miralax diariamente   *Please DO NOT go directly from our office to pick up this medication! Give the pharmacy 1 day to process the prescription as this is compounded and takes time to make.   seguimiento en 3 meses  If your blood pressure at your visit was 140/90 or greater, please contact your primary care physician to follow up on this.  _______________________________________________________  If you are age 63 or older, your body mass index should be between 23-30. Your Body mass index is 31.1 kg/m. If this is out of the aforementioned range listed, please consider follow up with your Primary Care Provider.  If you are age 48 or younger, your body mass index should be between 19-25. Your Body mass index is 31.1 kg/m. If this is out of the aformentioned range listed, please consider follow up with your Primary Care Provider.   ________________________________________________________  The [facility] providers would like to encourage you to use [facility] to communicate with providers for non-urgent requests or questions.  Due to long hold times on the telephone, sending your provider a message by [facility] may be a faster and more efficient way to get a response.  Please allow 48 business hours for a response.  Please remember that this is for non-urgent requests.  _______________________________________________________   Thank you for entrusting me with your care and for choosing [facility],  Dr. Eulah Pont

## 2023-10-11 NOTE — Progress Notes (Unsigned)
PROCEDURE NOTE: The patient presents with symptomatic grade 1 hemorrhoids, requesting rubber band ligation of his/her hemorrhoidal disease.  All risks, benefits and alternative forms of therapy were described and informed consent was obtained.  Patient describes some recent rectal bleeding and rectal pain.  In the Left Lateral Decubitus position anoscopic examination revealed grade 1 hemorrhoids and anterior anal fissure The anorectum was pre-medicated with Recticare and nitroglycerin The decision was made to band the right anterior internal hemorrhoid, and the [facility] O'Regan System was used to perform band ligation without complication.  Digital anorectal examination was then performed to assure proper positioning of the band, and to adjust the banded tissue as required.  The patient was discharged home without pain or other issues.  Dietary and behavioral recommendations were given and along with follow-up instructions.     The following adjunctive treatments were recommended: Daily Miralax Diltiazem 2% with Lidocaine 5% - Using your index finger, apply a small amount of medication inside the rectum up to your first knuckle/joint three times daily x 8 weeks.  The patient will return in 2-3 months for  follow-up.  No complications were encountered and the patient tolerated the procedure well.

## 2023-12-06 ENCOUNTER — Encounter: Payer: Self-pay | Admitting: Internal Medicine

## 2024-01-25 NOTE — Progress Notes (Unsigned)
 01/26/2024 Aaron Gonzales [id number] 12-Aug-1981  Referring provider: No ref. provider found Primary GI doctor: Dr. Leonides Schanz  ASSESSMENT AND PLAN:   Abdominal pain with history of constipation 03/2023 CT abdomen pelvis left flank pain cutaneous soft tissue thickening of right medial gluteal cleft extending to the rectum cellulitis no fluid collection or fluid-filled tract no acute intra-abdominal pelvic process, unremarkable colonoscopy, treated with antibiotics. No labs to review, no recent imaging Likely secondary to constipation with worsening hemorrhoids, on miralax 17 grams daily - increase to twice a day - add on fiber - did not do well with diltiazem, will treat with NTG and destin - increase miralax Bid - follow up Dr. Leonides Schanz possible banding  Rectal bleeding secondary to internal hemorrhoids 05/11/2023 colonoscopy normal TI, nonbleeding internal hemorrhoids recall 10 years Status post hemorrhoidal banding 07/01/2023, 08/16/2023 and 10/11/2023, had improvement but came back On exam appears to be an anal fissure Will give camol suppositories Add on desitin Fix bowel movements Given NTG since patient did not respond to diltiazem Schedule follow up with Dr. Leonides Schanz for possible banding  Self pay patient Due to language barrier, an interpreter was present during the history-taking and subsequent discussion (and for part of the physical exam) with this patient.   Patient Care Team: Patient, No Pcp Per as PCP - General (General Practice)  HISTORY OF PRESENT ILLNESS: 43 y.o. Spanish-speaking self-pay  male with a past medical history of hypertension, atrial fibrillation, hemorrhoids and others listed below presents for evaluation of abdominal pain.   Discussed the use of AI scribe software for clinical note transcription with the patient, who gave verbal consent to proceed.  History of Present Illness   Aaron Gonzales is a 43 year old male who presents with worsening  symptoms related to hemorrhoids and an anal fissure.  He has been experiencing worsening symptoms related to his hemorrhoids, including significant inflammation and occasional bright red bleeding, particularly when constipated. The symptoms are exacerbated by hot weather. Despite regular bowel movements, he continues to experience inflammation and suspects the presence of a new hemorrhoid due to feeling a 'little ball' internally. He describes the blood as bright red and notes it is present when wiping, not in the toilet water. No stomach pain, but there is rectal burning and itching.  He recalls a previous diagnosis of an anal fissure and was given an ointment that did not alleviate his symptoms. He experiences frustration with the ongoing discomfort, which impacts his ability to work. The fissure is causing most of the current symptoms, including inflammation and tenderness.  He experiences occasional constipation, which exacerbates hemorrhoid and fissure symptoms. He is currently taking Miralax daily, which has helped reduce constipation but not completely resolved it.  He has a history of using hydrocortisone suppositories prescribed for hemorrhoid relief, but they have not been effective in alleviating his current symptoms.        He  reports that he has never smoked. He has never used smokeless tobacco. He reports that he does not currently use alcohol. He reports that he does not use drugs.  RELEVANT GI HISTORY, IMAGING AND LABS: CT A/P w/contrast 04/01/23: IMPRESSION: 1. Cutaneous soft tissue thickening of the right medial gluteal cleft extending to the rectum, which may represent cellulitis. No fluid collection or definite fluid-filled tract. 2. No acute intra-abdominal or pelvic process.  CBC    Component Value Date/Time   WBC 6.4 04/01/2023 0755   RBC 5.35 04/01/2023 0755   HGB 16.2 04/01/2023  0755   HCT 47.2 04/01/2023 0755   PLT 266 04/01/2023 0755   MCV 88.2 04/01/2023  0755   MCH 30.3 04/01/2023 0755   MCHC 34.3 04/01/2023 0755   RDW 13.2 04/01/2023 0755   LYMPHSABS 2.0 04/01/2023 0755   MONOABS 0.4 04/01/2023 0755   EOSABS 0.2 04/01/2023 0755   BASOSABS 0.1 04/01/2023 0755   Recent Labs    04/01/23 0755  HGB 16.2    CMP     Component Value Date/Time   NA 134 (L) 04/01/2023 0755   K 4.0 04/01/2023 0755   CL 101 04/01/2023 0755   CO2 23 04/01/2023 0755   GLUCOSE 122 (H) 04/01/2023 0755   BUN 17 04/01/2023 0755   CREATININE 1.02 04/01/2023 0755   CALCIUM 9.5 04/01/2023 0755   PROT 7.4 04/01/2023 0755   ALBUMIN 4.1 04/01/2023 0755   AST 24 04/01/2023 0755   ALT 28 04/01/2023 0755   ALKPHOS 48 04/01/2023 0755   BILITOT 0.4 04/01/2023 0755   GFRNONAA >60 04/01/2023 0755      Latest Ref Rng & Units 04/01/2023    7:55 AM  Hepatic Function  Total Protein 6.5 - 8.1 g/dL 7.4   Albumin 3.5 - 5.0 g/dL 4.1   AST 15 - 41 U/L 24   ALT 0 - 44 U/L 28   Alk Phosphatase 38 - 126 U/L 48   Total Bilirubin 0.3 - 1.2 mg/dL 0.4       Current Medications:    Current Outpatient Medications (Cardiovascular):    lisinopril (ZESTRIL) 20 MG tablet, Take 20 mg by mouth at bedtime.     Current Outpatient Medications (Other):    AMBULATORY NON FORMULARY MEDICATION, Medication Name: Diltiazem 2% gel with 5 % lidocaine  Using your index finger, apply a pea size amount of medication inside the rectum up to your first knuckle/joint 3 times daily x 8 weeks.   hydrocortisone (ANUSOL-HC) 2.5 % rectal cream, Place 1 Application rectally 2 (two) times daily.  Medical History:  Past Medical History:  Diagnosis Date   Anal fissure    Atrial fibrillation (HCC)    Elevated cholesterol    High blood pressure    Infectious colitis    UTI (urinary tract infection)    Allergies: No Known Allergies   Surgical History:  He  has a past surgical history that includes Appendectomy. Family History:  His family history is not on file.  REVIEW OF SYSTEMS  : All  other systems reviewed and negative except where noted in the History of Present Illness.  PHYSICAL EXAM: BP 112/80 (BP Location: Left Arm, Patient Position: Sitting, Cuff Size: Normal)   Pulse 73   Ht 5' 0.5" (1.537 m)   Wt 150 lb 8 oz (68.3 kg)   BMI 28.91 kg/m  Physical Exam   GENERAL APPEARANCE: Well nourished, in no apparent distress. HEENT: No cervical lymphadenopathy, unremarkable thyroid, sclerae anicteric, conjunctiva pink. RESPIRATORY: Respiratory effort normal, breath sounds equal bilaterally without rales, rhonchi, or wheezing. CARDIO: Regular rate and rhythm with no murmurs, rubs, or gallops, peripheral pulses intact. ABDOMEN: Soft, non-distended, active bowel sounds in all four quadrants, no tenderness to palpation, no rebound, no mass appreciated. RECTAL: External skin breakdown noted, rectal exam reveals tenderness, rectal fissure noted. MUSCULOSKELETAL: Full range of motion, normal gait, without edema. SKIN: Dry, intact without rashes or lesions. No jaundice. NEURO: Alert, oriented, no focal deficits. PSYCH: Cooperative, normal mood and affect.      Doree Albee, PA-C 11:16  AM

## 2024-01-26 ENCOUNTER — Encounter: Payer: Self-pay | Admitting: Physician Assistant

## 2024-01-26 ENCOUNTER — Ambulatory Visit: Payer: Self-pay | Admitting: Physician Assistant

## 2024-01-26 VITALS — BP 112/80 | HR 73 | Ht 60.5 in | Wt 150.5 lb

## 2024-01-26 DIAGNOSIS — Z8719 Personal history of other diseases of the digestive system: Secondary | ICD-10-CM

## 2024-01-26 DIAGNOSIS — R109 Unspecified abdominal pain: Secondary | ICD-10-CM

## 2024-01-26 DIAGNOSIS — K625 Hemorrhage of anus and rectum: Secondary | ICD-10-CM

## 2024-01-26 DIAGNOSIS — K648 Other hemorrhoids: Secondary | ICD-10-CM

## 2024-01-26 DIAGNOSIS — K649 Unspecified hemorrhoids: Secondary | ICD-10-CM

## 2024-01-26 NOTE — Patient Instructions (Addendum)
 Tiene una cita con el Dr. Radene Knee 04-11-24 a las 11:10 a. m. Por favor, llegue 10 minutos antes de su cita.  You have been scheduled for an appointment with Dr. Leonides Schanz on 04-11-24 at 11:10am . Please arrive 10 minutes early for your appointment.   Miralax es un laxante osmtico.  Slo aporta ms agua a las heces.  Es seguro tomarlo a diario.  Puede tomar hasta 17 gramos de [employer] al da.  Mezclar con jugo o caf.  Comience con 1 tapn por la noche durante 3-4 das y reevale su respuesta en 3-4 das.  Puede aumentar y disminuir la dosis segn su respuesta.  Recuerde, pueden pasar entre 3 y 4 das hasta que surta efecto O hasta que los efectos desaparezcan.   A menudo lo combino con Benefiber por la maana para ayudar a asegurar que las heces no estn demasiado sueltas.  Fisura Anal, Adulto  Una fisura es un defecto lineal en la mucosa anal. Los sntomas incluyen ardor, picazn y [place], especialmente al defecar, con sangrado rectal asociado. Los factores de riesgo incluyen una dieta baja en fibra, estreimiento crnico y esfuerzo. Las fisuras anales pueden tardar [employer], por lo que este proceso durar de 2 a 3 meses.  El tratamiento para una fisura incluye: - Reducir el tiempo que pasa en el bao a no ms de 5 minutos. - Agregar un suplemento de Midwife o [employer]. - Aumentar la ingesta de agua. - Existe un supositorio lubricante de venta libre llamado Calmol-4 que se puede conseguir en [employer] o en la farmacia (quizs tenga que pedirlo). Quiero [facility] al da, por la maana y por la noche, durante 8 semanas.  Esto tiene una tasa de xito del 60 al 80 %. - Tambin voy a enviar una crema bloqueadora de los canales de calcio a una farmacia de productos compuestos para aplicarla dos veces al da durante 12 semanas.  NTG 1,25% use la mano enguantada, aplique una cantidad del tamao de un guisante cada 6 a 8 horas para Development worker, community, n. 30  gramos, sin recargas.   [place] Desitin para protectar tu piel   Sent this medication to a compound pharmacy:  [facility] [address] [place], [place], [place] [address]  [phone]   Please DO NOT go directly from our office to pick up this medication! Give the pharmacy 1 day to process the prescription. Extra time is required for them to compound your medication.  Hemorroides Hemorrhoids Las hemorroides son venas hinchadas en el recto o la zona que lo rodea, o en la abertura de las nalgas (ano). Hay dos tipos de hemorroides: Internas. Se forman en las venas del interior del recto. Pueden abultarse hacia afuera, irritarse y doler. Externas. Estas se producen en las venas que estn fuera del ano. Pueden sentirse como una hinchazn o un bulto duro dolorosos cerca del ano. La mayora de las hemorroides no causan problemas graves. A menudo, pueden tratarse en casa con cambios en la dieta y el estilo de vida. Si los tratamientos caseros no ayudan, quizs necesite un procedimiento para reducir o extirpar las hemorroides. Cules son las causas? Las hemorroides son causadas por la presin cerca del ano. Esta presin puede deberse a lo siguiente: Estreimiento o diarrea. Hace esfuerzo para eliminar heces. Embarazo. Obesidad. Estar sentado o andar en bicicleta durante mucho tiempo. Levantar objetos pesados u otras cosas que impliquen esfuerzo. Sexo anal. Cules son los signos o sntomas?  Los sntomas de esta afeccin incluyen: Engineer, mining. Picazn o irritacin anal. Sangrado que proviene del recto. Fuga de materia fecal (heces). Hinchazn del ano. Uno o ms bultos alrededor del ano. Cmo se diagnostica? Las hemorroides se diagnostican frecuentemente a travs de un examen visual. Posiblemente le realicen otros tipos de pruebas o estudios, como los siguientes: Psychiatrist. Esto lo realiza el mdico mediante tacto rectal con un dedo enguantado. Anoscopia. Este es un examen del  ano que se hace con un tubo pequeo. Anlisis de sangre si ha perdido [employer]. Sigmoidoscopa o colonoscopa. Estos son estudios para observar el interior del colon a travs de un tubo que tiene una cmara en el extremo. Cmo se trata? En la [employer], las hemorroides se pueden tratar en casa con cambios en la dieta y el estilo de vida. Si estos cambios no resultan eficaces, tal vez deba someterse a un procedimiento. Estos procedimientos pueden reducir las hemorroides o extirparlas completamente. Algunos de los procedimientos ms frecuentes son los siguientes: Ligadura con Curator. Las bandas elsticas se colocan en la base de las hemorroides para interrumpir su irrigacin de [place]. Escleroterapia. Se pone un medicamento dentro de las hemorroides para reducir su tamao. Coagulacin con luz infrarroja. Se utiliza un tipo de energa lumnica para eliminar las hemorroides. Hemorroidectoma. Las hemorroides se extirpan durante la [place]. Luego, las venas que las irrigan se atan para Geologist, engineering. Hemorroidopexia con grapas. La base de las hemorroides se engrapa a la pared del recto. Siga estas instrucciones en su casa: Medicamentos Use los medicamentos de venta libre y los recetados solamente como se lo haya indicado el mdico. Use cremas medicadas o medicamentos medicinales que se ponen en el recto (supositorios) como se lo haya indicado el mdico. Comida y bebida  Consumir alimentos ricos en fibra, como frijoles, cereales integrales, y frutas y verduras frescas. Pregntele a su mdico acerca de tomar productos con fibra aadida (suplementos de [place]). Disminuya la cantidad de grasa de la dieta. Esto se puede lograr consumiendo productos lcteos con bajo contenido de grasas, ingiriendo menor cantidad de carnes rojas y evitando los alimentos procesados. Beber suficiente lquido para Radio producer pis (orina) de color amarillo plido. Control del dolor y la hinchazn  Tome baos  de asiento tibios durante 20 minutos, 3 a 4 veces por Futures trader. Esto puede ayudar a Glass blower/designer y Environmental health practitioner. Puede hacer esto en una baera o usar un dispositivo porttil para bao de asiento que se coloca sobre el inodoro. Si se lo indican, aplique hielo en la zona afectada. El uso de compresas de hielo entre baos de asiento puede ser de [place]. Ponga el hielo en una bolsa plstica. Coloque una toalla entre la piel y Copy. Aplique el hielo durante 20 minutos, 2 a 3 veces por da. Si la piel se le pone de color rojo brillante, retire el hielo de inmediato para evitar daos en la piel. El [place] de dao es mayor si no puede sentir dolor, Airline pilot o fro. Instrucciones generales Actividad fsica. Consulte al mdico qu cantidad y qu tipo de ejercicio es mejor para usted. En general, debe realizar al menos 30 minutos de ejercicio moderado la [employer]V de la semana (150 minutos cada semana). Es recomendable que pruebe con caminar, andar en bicicleta o practicar yoga. Vaya al bao cuando sienta ganas de defecar. No espere. Evite hacer esfuerzos para defecar. Mantenga el ano limpio y seco. Use papel higinico hmedo o toallitas humedecidas despus de  defecar. No pase mucho tiempo sentado en el inodoro. Esto puede aumentar la afluencia de sangre y Chief Technology Officer. Dnde buscar ms informacin [employer] Mills of Diabetes and Digestive and Kidney Diseases [employer] de la Diabetes y las Enfermedades Digestivas y Renales): StageSync.si Comunquese con un mdico si: Tiene ms dolor e hinchazn que no mejoran con Scientist, research (medical). Tiene dificultad para defecar o no puede hacerlo. Siente dolor o tiene inflamacin fuera de la zona de las hemorroides. Solicite ayuda de inmediato si: Tiene sangrado del recto y no puede detenerlo. Esta informacin no tiene Theme park manager el consejo del mdico. Asegrese de hacerle al mdico cualquier pregunta que tenga. Document Revised: 08/13/2022  Document Reviewed: 08/13/2022 Elsevier Patient Education  2024 [employer].

## 2024-04-11 ENCOUNTER — Ambulatory Visit: Payer: Self-pay | Admitting: Internal Medicine

## 2024-04-11 ENCOUNTER — Encounter: Payer: Self-pay | Admitting: Internal Medicine

## 2024-04-11 VITALS — BP 122/78 | HR 64 | Ht 60.0 in | Wt 152.0 lb

## 2024-04-11 DIAGNOSIS — K5900 Constipation, unspecified: Secondary | ICD-10-CM

## 2024-04-11 DIAGNOSIS — K640 First degree hemorrhoids: Secondary | ICD-10-CM

## 2024-04-11 DIAGNOSIS — Z8719 Personal history of other diseases of the digestive system: Secondary | ICD-10-CM

## 2024-04-11 DIAGNOSIS — K649 Unspecified hemorrhoids: Secondary | ICD-10-CM

## 2024-04-11 NOTE — Patient Instructions (Addendum)
 Comience a tomar Miralax 2 veces al da.  HEMORRHOID BANDING PROCEDURE    FOLLOW-UP CARE   The procedure you have had should have been relatively painless since the banding of the area involved does not have nerve endings and there is no pain sensation.  The rubber band cuts off the blood supply to the hemorrhoid and the band may fall off as soon as 48 hours after the banding (the band may occasionally be seen in the toilet bowl following a bowel movement). You may notice a temporary feeling of fullness in the rectum which should respond adequately to plain Tylenol or Motrin.  Following the banding, avoid strenuous exercise that evening and resume full activity the next day.  A sitz bath (soaking in a warm tub) or bidet is soothing, and can be useful for cleansing the area after bowel movements.     To avoid constipation, take two tablespoons of natural wheat bran, natural oat bran, flax, Benefiber or any over the counter fiber supplement and increase your water intake to 7-8 glasses daily.    Unless you have been prescribed anorectal medication, do not put anything inside your rectum for two weeks: No suppositories, enemas, fingers, etc.  Occasionally, you may have more bleeding than usual after the banding procedure.  This is often from the untreated hemorrhoids rather than the treated one.  Don't be concerned if there is a tablespoon or so of blood.  If there is more blood than this, lie flat with your bottom higher than your head and apply an ice pack to the area. If the bleeding does not stop within a half an hour or if you feel faint, call our office at [phone] or go to the emergency room.  Problems are not common; however, if there is a substantial amount of bleeding, severe pain, chills, fever or difficulty passing urine (very rare) or other problems, you should call us  at [phone] or report to the nearest emergency room.  Do not stay seated continuously for more than 2-3  hours for a day or two after the procedure.  Tighten your buttock muscles 10-15 times every two hours and take 10-15 deep breaths every 1-2 hours.  Do not spend more than a few minutes on the toilet if you cannot empty your bowel; instead re-visit the toilet at a later time.   Thank you for entrusting me with your care and for choosing [facility], Dr. Regino Caprio

## 2024-04-11 NOTE — Progress Notes (Signed)
 04/11/2024 Aaron Gonzales [id number] 09/08/1981  ASSESSMENT AND PLAN:   Constipation Hemorrhoids Anal fissure - resolved 03/2023 CT abdomen pelvis left flank pain cutaneous soft tissue thickening of right medial gluteal cleft extending to the rectum cellulitis no fluid collection or fluid-filled tract no acute intra-abdominal pelvic process, unremarkable colonoscopy, treated with antibiotics. 05/11/2023 colonoscopy normal TI, nonbleeding internal hemorrhoids recall 10 years Status post hemorrhoidal banding 07/01/2023, 08/16/2023 and 10/11/2023, had improvement but came back - Continue fiber supplement - Try to switch from magnesium citrate to Miralax BID - Plan for two sessions of banding. One banding session performed today.  PROCEDURE NOTE: The patient presents with symptomatic grade 1 hemorrhoids, requesting rubber band ligation of his/her hemorrhoidal disease.  All risks, benefits and alternative forms of therapy were described and informed consent was obtained.  In the Left Lateral Decubitus position anoscopic examination revealed grade 1 hemorrhoids The anorectum was pre-medicated with nitroglycerin and Recticare The decision was made to band the posterior internal hemorrhoid, and the [facility] O'Regan System was used to perform band ligation without complication.  Digital anorectal examination was then performed to assure proper positioning of the band, and to adjust the banded tissue as required.  The patient was discharged home without pain or other issues.  Dietary and behavioral recommendations were given and along with follow-up instructions.     The following adjunctive treatments were recommended: Miralax BID Avoid straining and sitting on the toilet for long periods of time  The patient will return in 4 weeks for  follow-up and possible additional banding as required. No complications were encountered and the patient tolerated the procedure well.    Patient Care  Team: Patient, No Pcp Per as PCP - General (General Practice)  HISTORY OF PRESENT ILLNESS: 43 y.o. Spanish-speaking self-pay  male with a past medical history of hypertension, atrial fibrillation, hemorrhoids and others listed below presents for evaluation of abdominal pain.   Interval History: Patient is here with Spanish interpreter. He was diagnosed with an anal fissure and has been using NTG ointment, which has helped significantly. He can still feel some inflammation in his rectal area. Denies excessive wiping or use of Wet Wipes. Denies rectal bleeding. Denies rectal pain. Endorses rectal itching. Sometimes he gets constipated. He is using magnesium citrate every day. On that regimen, he is having 2 BMs per day. He is straining just a little bit, but sometimes he sits on the toilet for 10 minutes at a time. He is not taking any Miralax because he ran out. The fiber appears to be helping somewhat. He had significant benefit from hemorrhoidal banding previously.   RELEVANT GI HISTORY, IMAGING AND LABS: CT A/P w/contrast 04/01/23: IMPRESSION: 1. Cutaneous soft tissue thickening of the right medial gluteal cleft extending to the rectum, which may represent cellulitis. No fluid collection or definite fluid-filled tract. 2. No acute intra-abdominal or pelvic process.  CBC    Component Value Date/Time   WBC 6.4 04/01/2023 0755   RBC 5.35 04/01/2023 0755   HGB 16.2 04/01/2023 0755   HCT 47.2 04/01/2023 0755   PLT 266 04/01/2023 0755   MCV 88.2 04/01/2023 0755   MCH 30.3 04/01/2023 0755   MCHC 34.3 04/01/2023 0755   RDW 13.2 04/01/2023 0755   LYMPHSABS 2.0 04/01/2023 0755   MONOABS 0.4 04/01/2023 0755   EOSABS 0.2 04/01/2023 0755   BASOSABS 0.1 04/01/2023 0755   No results for input(s): "HGB" in the last 8760 hours.   CMP  Component Value Date/Time   NA 134 (L) 04/01/2023 0755   K 4.0 04/01/2023 0755   CL 101 04/01/2023 0755   CO2 23 04/01/2023 0755   GLUCOSE 122 (H)  04/01/2023 0755   BUN 17 04/01/2023 0755   CREATININE 1.02 04/01/2023 0755   CALCIUM 9.5 04/01/2023 0755   PROT 7.4 04/01/2023 0755   ALBUMIN 4.1 04/01/2023 0755   AST 24 04/01/2023 0755   ALT 28 04/01/2023 0755   ALKPHOS 48 04/01/2023 0755   BILITOT 0.4 04/01/2023 0755   GFRNONAA >60 04/01/2023 0755      Latest Ref Rng & Units 04/01/2023    7:55 AM  Hepatic Function  Total Protein 6.5 - 8.1 g/dL 7.4   Albumin 3.5 - 5.0 g/dL 4.1   AST 15 - 41 U/L 24   ALT 0 - 44 U/L 28   Alk Phosphatase 38 - 126 U/L 48   Total Bilirubin 0.3 - 1.2 mg/dL 0.4       Current Medications:    Current Outpatient Medications (Cardiovascular):    hydrochlorothiazide (HYDRODIURIL) 12.5 MG tablet, Take 12.5 mg by mouth daily.   lisinopril (ZESTRIL) 20 MG tablet, Take 20 mg by mouth at bedtime.     Current Outpatient Medications (Other):    AMBULATORY NON FORMULARY MEDICATION, Medication Name: Diltiazem 2% gel with 5 % lidocaine  Using your index finger, apply a pea size amount of medication inside the rectum up to your first knuckle/joint 3 times daily x 8 weeks.   hydrocortisone  (ANUSOL -HC) 2.5 % rectal cream, Place 1 Application rectally 2 (two) times daily. (Patient not taking: Reported on 04/11/2024)  Medical History:  Past Medical History:  Diagnosis Date   Anal fissure    Atrial fibrillation (HCC)    Elevated cholesterol    High blood pressure    Infectious colitis    UTI (urinary tract infection)    Allergies: No Known Allergies   Surgical History:  He  has a past surgical history that includes Appendectomy. Family History:  His family history is not on file.   PHYSICAL EXAM: BP 122/78   Pulse 64   Ht 5' (1.524 m)   Wt 152 lb (68.9 kg)   BMI 29.69 kg/m  Physical Exam        General: Well appearing Rectal: Anoscopy was performed. Previous anal fissure had healed. Two columns of internal hemorrhoids visualized.  Daina Drum, MD 11:40 AM

## 2024-05-16 ENCOUNTER — Encounter: Payer: Self-pay | Admitting: Internal Medicine

## 2024-05-23 ENCOUNTER — Encounter: Payer: Self-pay | Admitting: Internal Medicine

## 2024-05-23 ENCOUNTER — Ambulatory Visit (INDEPENDENT_AMBULATORY_CARE_PROVIDER_SITE_OTHER): Payer: Self-pay | Admitting: Internal Medicine

## 2024-05-23 VITALS — BP 110/70 | HR 64 | Ht 60.05 in | Wt 148.2 lb

## 2024-05-23 DIAGNOSIS — K649 Unspecified hemorrhoids: Secondary | ICD-10-CM

## 2024-05-23 DIAGNOSIS — K640 First degree hemorrhoids: Secondary | ICD-10-CM

## 2024-05-23 NOTE — Patient Instructions (Signed)
 HEMORRHOID BANDING PROCEDURE    FOLLOW-UP CARE   The procedure you have had should have been relatively painless since the banding of the area involved does not have nerve endings and there is no pain sensation.  The rubber band cuts off the blood supply to the hemorrhoid and the band may fall off as soon as 48 hours after the banding (the band may occasionally be seen in the toilet bowl following a bowel movement). You may notice a temporary feeling of fullness in the rectum which should respond adequately to plain Tylenol or Motrin.  Following the banding, avoid strenuous exercise that evening and resume full activity the next day.  A sitz bath (soaking in a warm tub) or bidet is soothing, and can be useful for cleansing the area after bowel movements.     To avoid constipation, take two tablespoons of natural wheat bran, natural oat bran, flax, Benefiber or any over the counter fiber supplement and increase your water intake to 7-8 glasses daily.    Unless you have been prescribed anorectal medication, do not put anything inside your rectum for two weeks: No suppositories, enemas, fingers, etc.  Occasionally, you may have more bleeding than usual after the banding procedure.  This is often from the untreated hemorrhoids rather than the treated one.  Don't be concerned if there is a tablespoon or so of blood.  If there is more blood than this, lie flat with your bottom higher than your head and apply an ice pack to the area. If the bleeding does not stop within a half an hour or if you feel faint, call our office at [phone] or go to the emergency room.  Problems are not common; however, if there is a substantial amount of bleeding, severe pain, chills, fever or difficulty passing urine (very rare) or other problems, you should call us at [phone] or report to the nearest emergency room.  Do not stay seated continuously for more than 2-3 hours for a day or two after the procedure.   Tighten your buttock muscles 10-15 times every two hours and take 10-15 deep breaths every 1-2 hours.  Do not spend more than a few minutes on the toilet if you cannot empty your bowel; instead re-visit the toilet at a later time.   Thank you for entrusting me with your care and for choosing [facility],  Dr. Eulah Pont

## 2024-05-23 NOTE — Progress Notes (Signed)
 PROCEDURE NOTE: The patient presents with symptomatic grade 1  hemorrhoids, requesting rubber band ligation of his/her hemorrhoidal disease.  All risks, benefits and alternative forms of therapy were described and informed consent was obtained.  In the Left Lateral Decubitus position anoscopic examination revealed grade 1 hemorrhoids. He did have excoriations with erythematous skin around the area of his anus and extending more posteriorly The anorectum was pre-medicated with Recticare and nitroglycerin The decision was made to band the right anterior internal hemorrhoid, and the [facility] O'Regan System was used to perform band ligation without complication.  Digital anorectal examination was then performed to assure proper positioning of the band, and to adjust the banded tissue as required.  The patient was discharged home without pain or other issues.  Dietary and behavioral recommendations were given and along with follow-up instructions.     The following adjunctive treatments were recommended: Barrier cream such as Desitin or calmoseptine ointment for the areas around his anus to try to keep that area try  The patient will return PRN for  follow-up and possible additional banding as required. No complications were encountered and the patient tolerated the procedure well.

## 2024-08-29 NOTE — Progress Notes (Unsigned)
 08/30/2024 Aaron Gonzales [id number] [birth year]  Referring provider: No ref. provider found Primary GI doctor: Dr. 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 05/11/2023 colonoscopy good bowel prep normal TI nonbleeding internal hemorrhoids recall 10 years - miralax twice a day - Increase fiber/ water intake, decrease caffeine, increase activity level.  Anal fissure He is having burning/rectal pain since his banding, rare BRB on TP, has BM twice a day with miralax but taking a few times a day and just a random spoon - increase to twice a day 17 grams - add on destin for barrier protection - add on fiber, avoid prolonged sitting - refill diltaizem/lidocaine - follow up Dr. Federico possible banding   internal hemorrhoids 05/11/2023 colonoscopy normal TI, nonbleeding internal hemorrhoids recall 10 years Status post hemorrhoidal banding 07/01/2023, 08/16/2023 and 10/11/2023, had improvement but came back Had hemorrhoid banding with Dr. Federico 05/23/2024 RA  Self pay/socioeconomic hardship Given papers for [employer] financial assistance  Due to language barrier, an interpreter was present during the history-taking and subsequent discussion (and for part of the physical exam) with this patient.   Patient Care Team: Patient, No Pcp Per as PCP - General (General Practice)  HISTORY OF PRESENT ILLNESS: 43 y.o. Spanish-speaking self-pay  male with a past medical history of hypertension, atrial fibrillation, hemorrhoids and others listed below presents for evaluation of abdominal pain.   Last seen 01/26/2024 by myself for rectal bleeding and abdominal pain with constipation.   Had unremarkable colonoscopy 05/10/2024. Had  hemorrhoid banding with Dr. Federico 05/23/2024 RA  Discussed the use of AI scribe software for clinical note transcription with the patient, who gave verbal consent to proceed.  History of Present Illness   Aaron Gonzales is a 43 year old male who presents for follow-up after colonoscopy and hemorrhoid banding.  He underwent a colonoscopy which was unremarkable. Previously, he was diagnosed with internal hemorrhoids in July 2024 and underwent banding of the right interior hemorrhoid on May 23, 2024.  He experiences continued slight bright red bleeding after bowel movements, which occurs intermittently. His main complaints are discomfort after bowel movements, running, itching, and pain.  He has been using Miralax, taking a spoonful three or four times with every meal, rather than a full cap. His stools are softer but can still be hard, leading to straining and prolonged sitting on the toilet.  He experiences vomiting twice a day. No abdominal pain.        He  reports that he has never smoked. He has never used smokeless tobacco. He reports that he does not currently use alcohol. He reports that he does not use drugs.  RELEVANT GI HISTORY, IMAGING AND LABS: CT A/P w/contrast 04/01/23: IMPRESSION: 1. Cutaneous soft tissue thickening of the right medial gluteal cleft extending to the rectum, which may represent cellulitis. No fluid collection or definite fluid-filled tract. 2. No acute intra-abdominal or pelvic process.  05/11/2023 colonoscopy good bowel prep normal TI nonbleeding internal hemorrhoids recall 10 years  CBC    Component Value Date/Time   WBC 6.4 04/01/2023 0755   RBC 5.35 04/01/2023 0755   HGB 16.2 04/01/2023 0755   HCT 47.2 04/01/2023 0755   PLT 266 04/01/2023 0755   MCV 88.2 04/01/2023 0755   MCH 30.3  04/01/2023 0755   MCHC 34.3 04/01/2023 0755   RDW 13.2 04/01/2023 0755   LYMPHSABS 2.0 04/01/2023 0755   MONOABS 0.4 04/01/2023 0755   EOSABS 0.2 04/01/2023  0755   BASOSABS 0.1 04/01/2023 0755   No results for input(s): HGB in the last 8760 hours.   CMP     Component Value Date/Time   NA 134 (L) 04/01/2023 0755   K 4.0 04/01/2023 0755   CL 101 04/01/2023 0755   CO2 23 04/01/2023 0755   GLUCOSE 122 (H) 04/01/2023 0755   BUN 17 04/01/2023 0755   CREATININE 1.02 04/01/2023 0755   CALCIUM 9.5 04/01/2023 0755   PROT 7.4 04/01/2023 0755   ALBUMIN 4.1 04/01/2023 0755   AST 24 04/01/2023 0755   ALT 28 04/01/2023 0755   ALKPHOS 48 04/01/2023 0755   BILITOT 0.4 04/01/2023 0755   GFRNONAA >60 04/01/2023 0755      Latest Ref Rng & Units 04/01/2023    7:55 AM  Hepatic Function  Total Protein 6.5 - 8.1 g/dL 7.4   Albumin 3.5 - 5.0 g/dL 4.1   AST 15 - 41 U/L 24   ALT 0 - 44 U/L 28   Alk Phosphatase 38 - 126 U/L 48   Total Bilirubin 0.3 - 1.2 mg/dL 0.4       Current Medications:    Current Outpatient Medications (Cardiovascular):    hydrochlorothiazide (HYDRODIURIL) 12.5 MG tablet, Take 12.5 mg by mouth daily.   lisinopril (ZESTRIL) 20 MG tablet, Take 20 mg by mouth at bedtime.     Current Outpatient Medications (Other):    AMBULATORY NON FORMULARY MEDICATION, Medication Name: Diltiazem 2%/Lidocaine 2% Using your index finger apply a small amount of medication inside the anal opening and to the external anal area twice daily x 6 weeks.   hydrocortisone  (ANUSOL -HC) 2.5 % rectal cream, Place 1 Application rectally 2 (two) times daily.   lidocaine-prilocaine (EMLA) cream, Apply 1 Application topically as needed.   polyethylene glycol powder (GLYCOLAX/MIRALAX) 17 GM/SCOOP powder, Take 17 g by mouth 2 (two) times daily.   Rectal Protectant-Emollient (CALMOL-4) 76-10 % SUPP, Place rectally. Uses twice a day  Medical History:  Past Medical History:  Diagnosis Date   Anal fissure    Atrial fibrillation (HCC)    Elevated cholesterol    High blood pressure    Infectious colitis    UTI (urinary tract infection)    Allergies: No  Known Allergies   Surgical History:  He  has a past surgical history that includes Appendectomy and hemmorroid banding. Family History:  His family history includes Other in his mother; Pulmonary disease in his father.  REVIEW OF SYSTEMS  : All other systems reviewed and negative except where noted in the History of Present Illness.  PHYSICAL EXAM: BP 110/70   Pulse 66   Ht 5' 3 (1.6 m)   Wt 148 lb (67.1 kg)   BMI 26.22 kg/m  Physical Exam   GENERAL APPEARANCE: Well nourished, in no apparent distress HEENT: No cervical lymphadenopathy, unremarkable thyroid, sclerae anicteric, conjunctiva pink RESPIRATORY: Respiratory effort normal, BS equal bilateral without rales, rhonchi, wheezing CARDIO: RRR with no MRGs, peripheral pulses intact ABDOMEN: Soft, non distended, active bowel sounds in all 4 quadrants, no tenderness to palpation, no rebound, no mass appreciated RECTAL: posterior fissure noted, irritated/erythematous around rectum, did not do internal exam MUSCULOSKELETAL: Full ROM, normal gait, without edema SKIN: Dry, intact without rashes or lesions. No jaundice. NEURO: Alert, oriented, no focal deficits PSYCH:  Cooperative, normal mood and affect.      Alan JONELLE Coombs, PA-C 11:23 AM

## 2024-08-30 ENCOUNTER — Ambulatory Visit (INDEPENDENT_AMBULATORY_CARE_PROVIDER_SITE_OTHER): Payer: Self-pay | Admitting: Physician Assistant

## 2024-08-30 ENCOUNTER — Encounter: Payer: Self-pay | Admitting: Physician Assistant

## 2024-08-30 VITALS — BP 110/70 | HR 66 | Ht 63.0 in | Wt 148.0 lb

## 2024-08-30 DIAGNOSIS — K5900 Constipation, unspecified: Secondary | ICD-10-CM

## 2024-08-30 DIAGNOSIS — K602 Anal fissure, unspecified: Secondary | ICD-10-CM

## 2024-08-30 DIAGNOSIS — K649 Unspecified hemorrhoids: Secondary | ICD-10-CM

## 2024-08-30 MED ORDER — LIDOCAINE-PRILOCAINE 2.5-2.5 % EX CREA: 1.0000 | TOPICAL_CREAM | CUTANEOUS | 0 refills | Status: AC | PRN

## 2024-08-30 MED ORDER — AMBULATORY NON FORMULARY MEDICATION: 1 refills | Status: AC

## 2024-08-30 NOTE — Patient Instructions (Addendum)
 Vuelva a llamar en diciembre para programar una cita de seguimiento con el Dr. Federico, [place] [id number]  Miralax es un laxante osmtico.  Slo aporta ms agua a las heces.  Es seguro tomarlo a diario.  Puede tomar hasta 17 gramos de [employer] al da.  Mezclar con jugo o caf.  Comience con 1 tapn por la noche durante 3-4 das y reevale su respuesta en 3-4 das.  Puede aumentar y disminuir la dosis segn su respuesta.  Recuerde, pueden pasar entre 3 y 4 das hasta que surta efecto O hasta que los efectos desaparezcan.   A menudo lo combino con [employer] por la maana para ayudar a asegurar que las heces no estn demasiado sueltas.  Desitin se puede comprar sin receta como crema protectora. Recetar lidocana/Recticare para usar.    Fisura Anal, Adulto  Una fisura es un defecto lineal en la mucosa anal. Los sntomas incluyen ardor, picazn y molestias, especialmente al defecar, con sangrado rectal asociado. Los factores de riesgo incluyen una dieta baja en fibra, estreimiento crnico y esfuerzo. Las fisuras anales pueden tardar [employer], por lo que este proceso durar de 2 a 3 meses.  El tratamiento para una fisura incluye: - Reducir el tiempo que pasa en el bao a no ms de 5 minutos. - Agregar un suplemento de Midwife o [employer]. - Aumentar la ingesta de agua. - Existe un supositorio lubricante de venta libre llamado Calmol-4 que se puede conseguir en [employer] o en la farmacia (quizs tenga que pedirlo). Quiero [employer] al da, por la maana y por la noche, durante 8 semanas.  Esto tiene una tasa de xito del 60 al 80 %. - Tambin voy a enviar una crema bloqueadora de los canales de calcio a una farmacia de productos compuestos para aplicarla dos veces al da durante 12 semanas.   Consejos para ir al bao y barnet poor estreimiento - Renea al menos 1,8-2,2 litros de agua/lquido al [employer]. - Sintese completamente en el inodoro, manteniendo la espalda  recta y, [place] se incorpora, intente apoyar la parte superior de los antebrazos sobre la parte superior de los muslos. - Elevar los pies con un taburete o un orinal puede ser til para mejorar el ngulo que permite que las heces pasen por el recto. - Relaje el recto sintiendo cmo se abomba hacia el agua del inodoro. Si siente que el recto se eleva hacia el cuerpo, se est contrayendo en lugar de relajarse. - Inhale y exhale lentamente. Respire profundamente expandiendo el abdomen hacia el ombligo. Mantenga el abdomen expandido mientras dirige suavemente la presin hacia abajo y de regreso al ano. Un sonido grave (GRRR) puede ayudar a aumentar la presin intraabdominal. (Tambin puede intentar soplar un molinillo y Glass blower/designer; esto ayuda con la misma respiracin abdominal). - Repita de 3 a 4 veces. Si no tiene xito, contraiga el suelo plvico para recuperar el tono muscular y levantarse del inodoro. Evite el esfuerzo excesivo. - Para reducir la necesidad de limpiarse excesivamente, enseando al ano a contraerse con normalidad, coloque las [employer] la cara externa de las rodillas y [place] el movimiento de las rodillas hacia afuera. Mantenga la posicin de 5 a 10 segundos. Luego, coloque las manos justo dentro de las rodillas y [place] el movimiento de las rodillas [place]. Mantenga la posicin 5 segundos. Repita varias veces en cada direccin.

## 2024-12-07 NOTE — Progress Notes (Signed)
 "    12/08/2024 Aaron Gonzales [id number] 1981-06-18  Referring provider: No ref. provider found Primary GI doctor: Dr. Federico  ASSESSMENT AND PLAN:  Perianal irritation, possible rectal cellulitis/abscess, no visible fissure but very tender exam, internal hemorrhoids continue miralax twice a day 17 grams Diltaizem/lidocaine  did not help - will send in skin barrier cream - sent in cipro /flagyl  for possible cellulitis/abscess - stop the hydrocortisone  external cream, will send in a nystatin  cream - add on fiber, avoid prolonged sitting - follow up Dr. 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 05/11/2023 colonoscopy good bowel prep normal TI nonbleeding internal hemorrhoids recall 10 years miralax twice a day and can still have hard or soft stools - continue miralax twice a day, add on benefiber once a day - Increase fiber/ water intake, decrease caffeine, increase activity level.   internal hemorrhoids 05/11/2023 colonoscopy normal TI, nonbleeding internal hemorrhoids recall 10 years Status post hemorrhoidal banding 07/01/2023, 08/16/2023 and 10/11/2023, had improvement but came back Had hemorrhoid banding with Dr. Federico 05/23/2024 RA  Self pay/socioeconomic hardship Given papers for [employer] financial assistance  Due to language barrier, an interpreter was present during the history-taking and subsequent discussion (and for part of the physical exam) with this patient.   Patient Care Team: Patient, No Pcp Per as PCP - General (General Practice)  HISTORY OF PRESENT ILLNESS: 44 y.o. Spanish-speaking self-pay  male with a past medical history of hypertension, atrial  fibrillation, hemorrhoids and others listed below presents for evaluation of abdominal pain.   Last seen 08/30/2024 by myself for rectal bleeding and abdominal pain with constipation.   Had unremarkable colonoscopy 05/10/2024. Had hemorrhoid banding with Dr. Federico 05/23/2024 RA  Discussed the use of AI scribe software for clinical note transcription with the patient, who gave verbal consent to proceed.  History of Present Illness   Aaron Gonzales is a 44 year old male with chronic constipation, rectal fissure, and internal hemorrhoids who presents for follow-up of persistent rectal and perianal symptoms.  Constipation has persisted since at least October. He takes Miralax twice daily, dosing with a capful, and has bowel movements approximately twice per day, in the morning and evening. Stool consistency varies between hard and soft. No fiber supplement is used.  Rectal fissure symptoms include burning and itching in the rectal area, occurring even when spicy foods are avoided. Diltiazem cream provides partial relief, but symptoms return after bowel movements. Hydrocortisone  cream is applied to the external skin for irritation.  Blood is observed when wiping after bowel movements. Inflammation was particularly severe about two weeks ago.  History of similar skin inflammation or cellulitis in 2004. No use of Desitin or other over-the-counter barrier creams. No medication allergies.      He  reports that he has never smoked. He has never used smokeless tobacco. He reports that he does not currently use alcohol. He reports that he does not use drugs.  RELEVANT GI HISTORY, IMAGING AND LABS: CT A/P w/contrast 04/01/23: IMPRESSION: 1. Cutaneous soft tissue thickening of the right medial gluteal cleft extending to the rectum, which may represent cellulitis. No fluid collection or definite fluid-filled tract. 2. No acute intra-abdominal or pelvic process.  05/11/2023 colonoscopy good bowel prep  normal TI nonbleeding  internal hemorrhoids recall 10 years  CBC    Component Value Date/Time   WBC 6.4 04/01/2023 0755   RBC 5.35 04/01/2023 0755   HGB 16.2 04/01/2023 0755   HCT 47.2 04/01/2023 0755   PLT 266 04/01/2023 0755   MCV 88.2 04/01/2023 0755   MCH 30.3 04/01/2023 0755   MCHC 34.3 04/01/2023 0755   RDW 13.2 04/01/2023 0755   LYMPHSABS 2.0 04/01/2023 0755   MONOABS 0.4 04/01/2023 0755   EOSABS 0.2 04/01/2023 0755   BASOSABS 0.1 04/01/2023 0755   No results for input(s): HGB in the last 8760 hours.   CMP     Component Value Date/Time   NA 134 (L) 04/01/2023 0755   K 4.0 04/01/2023 0755   CL 101 04/01/2023 0755   CO2 23 04/01/2023 0755   GLUCOSE 122 (H) 04/01/2023 0755   BUN 17 04/01/2023 0755   CREATININE 1.02 04/01/2023 0755   CALCIUM 9.5 04/01/2023 0755   PROT 7.4 04/01/2023 0755   ALBUMIN 4.1 04/01/2023 0755   AST 24 04/01/2023 0755   ALT 28 04/01/2023 0755   ALKPHOS 48 04/01/2023 0755   BILITOT 0.4 04/01/2023 0755   GFRNONAA >60 04/01/2023 0755      Latest Ref Rng & Units 04/01/2023    7:55 AM  Hepatic Function  Total Protein 6.5 - 8.1 g/dL 7.4   Albumin 3.5 - 5.0 g/dL 4.1   AST 15 - 41 U/L 24   ALT 0 - 44 U/L 28   Alk Phosphatase 38 - 126 U/L 48   Total Bilirubin 0.3 - 1.2 mg/dL 0.4       Current Medications:   Current Outpatient Medications (Cardiovascular):    hydrochlorothiazide (HYDRODIURIL) 12.5 MG tablet, Take 12.5 mg by mouth daily.   lisinopril (ZESTRIL) 20 MG tablet, Take 20 mg by mouth at bedtime.  Current Outpatient Medications (Other):    AMBULATORY NON FORMULARY MEDICATION, Medication Name: Diltiazem 2%/Lidocaine  2% Using your index finger apply a small amount of medication inside the anal opening and to the external anal area twice daily x 6 weeks.   ciprofloxacin  (CIPRO ) 500 MG tablet, Take 1 tablet (500 mg total) by mouth 2 (two) times daily.   hydrocortisone  (ANUSOL -HC) 2.5 % rectal cream, Place 1 Application rectally 2  (two) times daily.   lidocaine -prilocaine  (EMLA ) cream, Apply 1 Application topically as needed.   metroNIDAZOLE  (FLAGYL ) 500 MG tablet, Take 1 tablet (500 mg total) by mouth 3 (three) times daily.   nystatin  cream (MYCOSTATIN ), Apply 1 Application topically 2 (two) times daily. rectally   polyethylene glycol powder (GLYCOLAX/MIRALAX) 17 GM/SCOOP powder, Take 17 g by mouth 2 (two) times daily.   Rectal Protectant-Emollient (CALMOL-4) 76-10 % SUPP, Place rectally. Uses twice a day  Medical History:  Past Medical History:  Diagnosis Date   Anal fissure    Atrial fibrillation (HCC)    Elevated cholesterol    High blood pressure    Infectious colitis    UTI (urinary tract infection)    Allergies: No Known Allergies   Surgical History:  He  has a past surgical history that includes Appendectomy and hemmorroid banding. Family History:  His family history includes Other in his mother; Pulmonary disease in his father.  REVIEW OF SYSTEMS  : All other systems reviewed and negative except where noted in the History of Present Illness.  PHYSICAL EXAM: BP 119/80   Pulse 65   Ht 5' 3 (1.6 m)   Wt 152 lb (68.9 kg)  BMI 26.93 kg/m  Physical Exam   GENERAL APPEARANCE: Well nourished, in no apparent distress HEENT: No cervical lymphadenopathy, unremarkable thyroid, sclerae anicteric, conjunctiva pink RESPIRATORY: Respiratory effort normal, BS equal bilateral without rales, rhonchi, wheezing CARDIO: RRR with no MRGs, peripheral pulses intact ABDOMEN: Soft, non distended, active bowel sounds in all 4 quadrants, no tenderness to palpation, no rebound, no mass appreciated RECTAL: Perianal skin irritation and erythema, tenderness in the perianal area, possible abscess on right buttock, possible cellulitis in the perianal area, induration in the perianal area, internal hemorrhoids present. MUSCULOSKELETAL: Full ROM, normal gait, without edema SKIN: Dry, intact without rashes or lesions. No  jaundice. NEURO: Alert, oriented, no focal deficits PSYCH: Cooperative, normal mood and affect.    Alan JONELLE Coombs, PA-C 11:35 AM   "

## 2024-12-08 ENCOUNTER — Ambulatory Visit (INDEPENDENT_AMBULATORY_CARE_PROVIDER_SITE_OTHER): Payer: Self-pay | Admitting: Physician Assistant

## 2024-12-08 ENCOUNTER — Ambulatory Visit: Payer: Self-pay | Admitting: Physician Assistant

## 2024-12-08 ENCOUNTER — Encounter: Payer: Self-pay | Admitting: Physician Assistant

## 2024-12-08 ENCOUNTER — Other Ambulatory Visit: Payer: Self-pay

## 2024-12-08 VITALS — BP 119/80 | HR 65 | Ht 63.0 in | Wt 152.0 lb

## 2024-12-08 DIAGNOSIS — K6289 Other specified diseases of anus and rectum: Secondary | ICD-10-CM

## 2024-12-08 DIAGNOSIS — K602 Anal fissure, unspecified: Secondary | ICD-10-CM

## 2024-12-08 DIAGNOSIS — K649 Unspecified hemorrhoids: Secondary | ICD-10-CM

## 2024-12-08 DIAGNOSIS — K581 Irritable bowel syndrome with constipation: Secondary | ICD-10-CM

## 2024-12-08 DIAGNOSIS — K5900 Constipation, unspecified: Secondary | ICD-10-CM

## 2024-12-08 DIAGNOSIS — K625 Hemorrhage of anus and rectum: Secondary | ICD-10-CM

## 2024-12-08 DIAGNOSIS — K648 Other hemorrhoids: Secondary | ICD-10-CM

## 2024-12-08 DIAGNOSIS — Z599 Problem related to housing and economic circumstances, unspecified: Secondary | ICD-10-CM

## 2024-12-08 LAB — CBC WITH DIFFERENTIAL/PLATELET
Basophils Absolute: 0.1 10*3/uL (ref 0.0–0.1)
Basophils Relative: 1.4 % (ref 0.0–3.0)
Eosinophils Absolute: 0.4 10*3/uL (ref 0.0–0.7)
Eosinophils Relative: 5.4 % — ABNORMAL HIGH (ref 0.0–5.0)
HCT: 43.9 % (ref 39.0–52.0)
Hemoglobin: 14.9 g/dL (ref 13.0–17.0)
Lymphocytes Relative: 32 % (ref 12.0–46.0)
Lymphs Abs: 2.1 10*3/uL (ref 0.7–4.0)
MCHC: 34 g/dL (ref 30.0–36.0)
MCV: 89.9 fl (ref 78.0–100.0)
Monocytes Absolute: 0.5 10*3/uL (ref 0.1–1.0)
Monocytes Relative: 8.2 % (ref 3.0–12.0)
Neutro Abs: 3.4 10*3/uL (ref 1.4–7.7)
Neutrophils Relative %: 53 % (ref 43.0–77.0)
Platelets: 253 10*3/uL (ref 150.0–400.0)
RBC: 4.89 Mil/uL (ref 4.22–5.81)
RDW: 13.9 % (ref 11.5–15.5)
WBC: 6.5 10*3/uL (ref 4.0–10.5)

## 2024-12-08 LAB — SEDIMENTATION RATE: Sed Rate: 7 mm/h (ref 0–15)

## 2024-12-08 MED ORDER — CIPROFLOXACIN HCL 500 MG PO TABS: 500.0000 mg | ORAL_TABLET | Freq: Two times a day (BID) | ORAL | 0 refills | Status: AC

## 2024-12-08 MED ORDER — METRONIDAZOLE 500 MG PO TABS: 500.0000 mg | ORAL_TABLET | Freq: Three times a day (TID) | ORAL | 0 refills | Status: AC

## 2024-12-08 MED ORDER — NYSTATIN 100000 UNIT/GM EX CREA: 1.0000 | TOPICAL_CREAM | Freq: Two times a day (BID) | CUTANEOUS | 1 refills | Status: AC

## 2024-12-08 NOTE — Progress Notes (Signed)
 I agree with the assessment and plan as outlined by Ms. Craig.

## 2024-12-08 NOTE — Patient Instructions (Addendum)
 Su proveedor le ha solicitado que vaya al stano para education officer, environmental anlisis de laboratorio antes de irse hoy. Presione B en el ascensor. El laboratorio est ubicado en la primera puerta a la izquierda al salir del medical sales representative.   [employer], aplica por la noche  Due to recent changes in healthcare laws, you may see the results of your imaging and laboratory studies on MyChart before your provider has had a chance to review them.  We understand that in some cases there may be results that are confusing or concerning to you. Not all laboratory results come back in the same time frame and the provider may be waiting for multiple results in order to interpret others.  Please give us  48 hours in order for your provider to thoroughly review all the results before contacting the office for clarification of your results.    I appreciate the  opportunity to care for you  Thank You   [facility]

## 2024-12-13 NOTE — Telephone Encounter (Signed)
 Attempted to call patient to relay message from provider regarding labs. No answer and no VM set up. Letter to be sent.

## 2024-12-22 ENCOUNTER — Ambulatory Visit: Payer: Self-pay | Admitting: Internal Medicine

## 2025-01-10 ENCOUNTER — Ambulatory Visit: Payer: Self-pay | Admitting: Internal Medicine
